# Patient Record
Sex: Female | Born: 1995 | Race: White | Hispanic: No | Marital: Single | Smoking: Never smoker
Health system: Southern US, Community
[De-identification: ages and names within clinical notes are randomized; demographics above are authoritative.]

## PROBLEM LIST (undated history)

## (undated) DIAGNOSIS — Z975 Presence of (intrauterine) contraceptive device: Secondary | ICD-10-CM

## (undated) DIAGNOSIS — F329 Major depressive disorder, single episode, unspecified: Secondary | ICD-10-CM

## (undated) DIAGNOSIS — F509 Eating disorder, unspecified: Secondary | ICD-10-CM

## (undated) DIAGNOSIS — F419 Anxiety disorder, unspecified: Secondary | ICD-10-CM

## (undated) HISTORY — DX: Major depressive disorder, single episode, unspecified: F32.9

## (undated) HISTORY — DX: Anxiety disorder, unspecified: F41.9

## (undated) HISTORY — DX: Presence of (intrauterine) contraceptive device: Z97.5

## (undated) HISTORY — PX: NO PAST SURGERIES: SHX2092

## (undated) HISTORY — DX: Eating disorder, unspecified: F50.9

---
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
## 2017-09-11 ENCOUNTER — Encounter (INDEPENDENT_AMBULATORY_CARE_PROVIDER_SITE_OTHER): Payer: Self-pay

## 2017-09-11 ENCOUNTER — Emergency Department (INDEPENDENT_AMBULATORY_CARE_PROVIDER_SITE_OTHER): Payer: 59

## 2017-09-11 VITALS — BP 118/69 | HR 70 | Temp 98.1°F | Resp 16 | Ht 68.0 in | Wt 140.0 lb

## 2017-09-11 DIAGNOSIS — Z760 Encounter for issue of repeat prescription: Secondary | ICD-10-CM

## 2017-09-11 MED ORDER — TRAZODONE HCL 50 MG OR TABS
50.00 mg | ORAL_TABLET | Freq: Every evening | ORAL | Status: DC
Start: ? — End: 2017-09-18

## 2017-09-11 MED ORDER — VORTIOXETINE HBR 20 MG PO TABS
20.00 mg | ORAL_TABLET | Freq: Every day | ORAL | Status: DC
Start: ? — End: 2017-09-11

## 2017-09-11 MED ORDER — VORTIOXETINE HBR 20 MG PO TABS
20.0000 mg | ORAL_TABLET | Freq: Every day | ORAL | 0 refills | Status: DC
Start: 2017-09-11 — End: 2017-09-18

## 2017-09-11 MED ORDER — LAMICTAL PO
Freq: Every evening | ORAL | Status: DC
Start: ? — End: 2017-09-18

## 2017-09-11 MED ORDER — TRAZODONE HCL 50 MG OR TABS
50.0000 mg | ORAL_TABLET | Freq: Every evening | ORAL | 0 refills | Status: AC
Start: 2017-09-11 — End: 2017-09-18

## 2017-09-11 NOTE — Interdisciplinary (Signed)
Patient Discharge/Education  AVS printed/explained to pt: Yes  Learner: pt  Pain addressed: none  Method: Explanation and Handout  Treatment education given: Yes  Response: Verbalizes understanding  Stable upon discharge: Yes; ok to exit   Pleased w/ service and informed of delay: Yes

## 2017-09-11 NOTE — Interdisciplinary (Signed)
MD at bedside. 

## 2017-09-11 NOTE — Interdisciplinary (Signed)
Two patient identifiers verified.  Pt called from waiting room to exam room, intake done, and informed on plan of care. Comfort measures offered: pt declined.

## 2017-09-11 NOTE — Interdisciplinary (Signed)
AVS printed, given to pt by C. Perez, LVN.

## 2017-09-11 NOTE — Interdisciplinary (Signed)
Round and gave pt bottled water

## 2017-09-12 NOTE — Progress Notes (Signed)
[facility] ATTENDING NOTE    LEXINGTON DEVINE  MRN: [id number]  DOB: 1996/01/30  PMD: No Pcp, Per Patient    CC: Medication Refill    HPI: Stacy Barker is a 21 year old female  has a past medical history of Anxiety and Major depressive disorder, single episode. who presents requesting a medication refill.  She reports that she recently moved from [place] to [place] and [place].  She reports that she has already arranged for a new psychiatrist in town but her 1st visit isn't for another week.  She is scheduled to see Dr. Olegario Messier.  The patient reports that she ran out of her medications and requests a refill to cover her until she follows up with Psychiatry as already scheduled.  Overall she reports that she feels good.  She denies any psychiatric decompensation and specifically denies suicidal or homicidal ideations.  She reports that she has been on Trintellix for 3 years and takes trazodone at night for sleep for the past 5 years at the same dosage as requested.  She presents with empty prescription bottles confirming the mentioned.    PMHx: see hpi  PSHx:  has no past surgical history on file.  Shx:  reports that she has never smoked. She has never used smokeless tobacco. She reports that she drinks alcohol.  FamHx: family history is not on file.  Meds: has a current medication list which includes the following prescription(s): lamotrigine, trazodone, trazodone, and vortioxetine.  ALL: Review of patient's allergies indicates no known allergies.  ROS: all other systems reviewed and negative as per my custom and practice for the above complaint    EXAM  BP 118/69 (BP Location: Right arm, BP Patient Position: Sitting, BP cuff size: Regular)   Pulse 70   Temp 98.1 F (36.7 C)   Resp 16   Ht  (1.727 m)   Wt 63.5 kg (140 lb)   LMP 09/07/2017 (Approximate)   SpO2 99%   BMI 21.29 kg/m2  GEN nad a&o nontoxic well appearing, smiling and pleasant  HEENT anicteric mmm,    NEURO alert and oriented x4,  moving all extremities well, normal speech, pleasant affect    A/P:   21 year old female presents requesting medication refill.  She just ran out of her psychiatric medications which she has been on for a long time.  She reports overall stable psychiatric state with history of major depressive episode once in the past prior to the initiation of treatment.  I am going to go ahead and prescribe her 1 week of her medication to cover her until she gets in to see Psychiatry as mentioned.  Resources were provided and return precautions discussed in regards to the anxiety and depression.    ICD-10-CM ICD-9-CM    1. Encounter for medication refill Z76.0 V68.1 vortioxetine (TRINTELLIX) 20 MG tablet      traZODone (DESYREL) 50 MG tablet         MEDICATIONS PRESCRIBED (FOR DISCHARGED ED PATIENTS)  New Prescriptions    TRAZODONE (DESYREL) 50 MG TABLET    Take 1 tablet (50 mg) by mouth nightly for 7 days.         I discussed with patient the nature of this patient's illness/problem, results of all resulted tests, course of treatment, and prospects for recovery/follow up care needs. patient verbalized understanding and agreement with plan. All questions answered.   Parameters of returning to ED/[place] also discussed with patient, who voiced understanding.  The patient understands to return immediately if the symptoms worsen or new symptoms develop. Additionally,  the patient will return if symptoms persist.

## 2017-09-18 ENCOUNTER — Ambulatory Visit (INDEPENDENT_AMBULATORY_CARE_PROVIDER_SITE_OTHER): Payer: 59 | Admitting: Psychiatry

## 2017-09-18 ENCOUNTER — Encounter (INDEPENDENT_AMBULATORY_CARE_PROVIDER_SITE_OTHER): Payer: Self-pay | Admitting: Psychiatry

## 2017-09-18 VITALS — BP 121/45 | HR 71 | Ht 68.0 in | Wt 147.0 lb

## 2017-09-18 DIAGNOSIS — F39 Unspecified mood [affective] disorder: Principal | ICD-10-CM

## 2017-09-18 DIAGNOSIS — F633 Trichotillomania: Secondary | ICD-10-CM

## 2017-09-18 DIAGNOSIS — F3341 Major depressive disorder, recurrent, in partial remission: Secondary | ICD-10-CM

## 2017-09-18 DIAGNOSIS — F411 Generalized anxiety disorder: Secondary | ICD-10-CM

## 2017-09-18 DIAGNOSIS — F449 Dissociative and conversion disorder, unspecified: Secondary | ICD-10-CM

## 2017-09-18 DIAGNOSIS — F339 Major depressive disorder, recurrent, unspecified: Secondary | ICD-10-CM

## 2017-09-18 DIAGNOSIS — G4700 Insomnia, unspecified: Secondary | ICD-10-CM

## 2017-09-18 DIAGNOSIS — R41840 Attention and concentration deficit: Secondary | ICD-10-CM

## 2017-09-18 MED ORDER — TRAZODONE HCL 50 MG OR TABS
50.0000 mg | ORAL_TABLET | Freq: Every evening | ORAL | 1 refills | Status: DC
Start: 2017-09-18 — End: 2017-11-25

## 2017-09-18 MED ORDER — VORTIOXETINE HBR 20 MG PO TABS
20.0000 mg | ORAL_TABLET | Freq: Every day | ORAL | 1 refills | Status: AC
Start: 2017-09-18 — End: 2017-09-25

## 2017-09-18 MED ORDER — LAMOTRIGINE 25 MG OR TABS
50.0000 mg | ORAL_TABLET | Freq: Every evening | ORAL | 1 refills | Status: DC
Start: 2017-09-18 — End: 2017-11-08

## 2017-09-18 NOTE — Progress Notes (Signed)
[facility] INITIAL VISIT      Identifying Information   Stacy Barker is a 21 yo, single female with history of depression, anxiety, trichotillomania and anorexia who presents for intake.    Referred by -self      Chief Complaint   Chief Complaint   Patient presents with    Depression    Anxiety    Sleep Problem       Review of Systems   1. Constitutional: Negative  2. Eyes: Negative  3. Ears, Nose, Mouth, Throat: Negative  4. Cardiovascular: Negative  5. Respiratory: Negative  6. Gastrointestinal: Negative  7. Genitourinary: Negative  8. Musculoskeletal: Negative  9. Integumentary: Negative  10. Neurological: Negative  11. Psychiatric: depression, anxiety, insomnia  12. Endocrine: Negative  13. Hematologic/Lymphatic: Negative  14. Allergic/Immunologic: Negative        History of Presenting Illness    Student reports primary goal to establish care with new psychiatrist since moving to [facility] for graduate school.  Ran out of psychiatric medication 3 weeks ago and reports worsening mood, with crying, "racing thoughts", low energy, isolation, and intrusive thoughts of "I should just kill myself". Proceeded to go to urgent care 8 days ago and was able to obtain a week supply of her medications. Now reports euthymic, "happy" mood, denies excessive worry, though does report some social anxiety, including worrying what others think of her. Overall, feels she's able to cope with her social anxiety and has made several friends since coming to [place] this August.  Other persistent symptoms including low energy during the day that is chronic "for years" but manageable, as patient is still able to exercise, go out with friends, and complete her graduate work. States class work is going "very well!"  Reports needing 9-10 hours of sleep to feel rested. Generally goes to bed at around 12am and gets up at 8am. Notes that she's on her computer late and that keeps her from getting to bed earlier. Takes  trazodone  nightly to help her fall asleep and this is effective. Reports eating healthy, regular meals and having good appetite.      Current medications: lamictal , trintellix , and trazodone  qhs. Denies any notable side effects.      Symptom Review  Depression -   Patient reports current euthymic mood. 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 Endorsed low mood, hopelessness, anhedonia, and suicidal thoughts at that time. Was admitted for first inpatient psychiatric hospitalization. Reports somewhat improved mood after her hospitalization but still struggling with depression and a suicide attempt the spring of 2014. Reports she has had stable mood for "about 2 years", though she states she's only been on current medication regimen for around 7-8 months and was adjusting   Mania/ hypomania -  Despite initial diagnosis of possible bipolar, denies any history of manic episodes.  Anxiety -   Reports social anxiety as described in HPI above, also past history of concern that others are talking about her or that she might be judged because of her little brother who might have Aspergers. Reports one panic attack in the context of stopping her medications 3 weeks ago but denies other history of panic symptoms. 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 Specifically pulling single strands of hair from her scalp. Denies bald patches. Continues to do this intermittently when she feels "tense".   Attentional problems -  Denies diagnosis of ADHD, stating that she had a form of neuropsych testing that "said I didn't", however, has been prescribed ritalin BID for "focus and to help mood."  Reports she did well in school growing up and does not have trouble with concentration in current graduate program  Disordered eating -   Reports history of anorexia in 2016, stating "I would stop eating for days at a time."  This was  in college and patient stated "it was a form of control for me." Micah Flesher to a treatment program near her school and states that while she was there she became motivated to eat again after learning she wouldn't be allowed to go to study abroad in [place] otherwise. Reports that she stopped restricting and has continued to eat regularly since then. Does report that she exercises 3-5x a week for around 1 hour. Denies any other excessive exercising or restrictive eating currently. Denies current concerns about her weight.  Psychosis -   Denies hx of paranoia, AH or VH.      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 now 3-4. Reports drinking is always in a social setting and never alone. Denies any hx of black outs, drinking while driving, or compromising situations. States that others have thought she may drink to much but denies any concern herself about her drinking.  Cannabis -  Reports rare use but not regularly, occasionally states it helps her anxiety  Amphetamines/Stimulants -  Reports using cocaine in the past, approx 10x in life  Other -  Reports trying club drugs ie MDMA at raves in past but denies current use      Past History    Psychiatric History   Past psychotherapy history -  Reports therapist for 2 years, support/talk therapy, found helpful  Past psychopharmacology trials -   Unsure, will obtain records. Currently lamictal , trazodone , and trintellix  daily.   Past psychiatric hospitalization(s) -   First 2013 for SI, 2nd 2014 after attempted hanging, also reports 3rd approx 2015-16 for SI  Past self-injurious behavior -   Reports attempted hanging as above, failed attempt and  proceeded to tell parents     Relevant Medical History    Family History  Bipolar disorder -   cousin  Anxiety disorder -   aunt    Social/Developmental History    Raised in [place]   Growing up at home in the family - patient reports happy home life with mom, dad, and little brother. Reports brother was somewhat frustrating to patient, as he possibly is on the autism spectrum and would embarrass her. Still close with family.  Intimate relationships - reports 1st of 4 months in college, then reports "casual sexual relationship" that ended upon patient coming to [facility], though he still contacts her. 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  High School - reports she transferred to public school, small, liked it better, however, had to miss the last 2 months due to mental health issues. 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      Objective    Allergies: denies    Medications:  As in HPI, no other medications      Labs Reviewed: none per chart      Vital Signs:   09/18/17  1438   BP: 121/45   Pulse: 71     GAD 7 09/18/2017   Feeling afraid as if something awful might happen 0   Being easily annoyed or irritable 0   Worrying too much about different things 0   Feeling nervous, anxious or on edge 1   Trouble relaxing 0   Being so restless that it is hard to sit still 1   GAD7 Patient Total 2   Not being  able to stop or control worrying 0     FM PHQ9 score 09/18/2017   PHQ9 Patient Summary Score (calculated) 6          Musculoskeletal  Ambulates with normal gait; no tremors     Mental Status Exam    Physical appearance: casually dressed, well groomed, well built  Relatedness: engaged  Eye contact: good  Attitude: cooperative  Language/Speech quality: clear with normal rate, tone and volume  Motor behavior: animated, using a lot of hand gestures  Mood: I feel stable, happy"  Affect: congruent with mood, bright, and ranges appropriately based on content  Thought process: linear, organized  Thought content: no evidence of psychotic process  Suicidal ideation: none  Homicidal ideation: none  Orientated to: person, place, time  Sensorium: intact  Attention/Concentration: intact  Memory: intact for recent and remote [employer] of knowledge: consistent with post secondary education  Insight: intact  Judgment: intact         Medical Decision Making      Narrative Assessment  Merin Belmar is a 21 yo, single female with history of depression, anxiety, trichotillomania and anorexia who presents for intake.    Patient presents with euthymic mood and fairly-well controlled anxiety after a period of instability following running out of her medications 3 weeks ago. Currently tolerating lamictal , trintellix , and trazodone  qhs well without any side effects. Also takes Ritalin BID, though unsure of the dosing. Feels she is stable on this medication regimen and plan to continue for now. Will adjust ritalin dose after confirming with previous provider Dr. Friday. Residual symptoms notable for daily fatigue, though mild enough that patient remains very active physically, academically, and socially. Recommended establishing primary care provider and labs to include thyroid screening. Given history of suicide attempt and decompensation in times of stress, also recommended establishing care with a therapist to create  a source of support in crises.       Today, spent time reviewing the benefits of combined treatment (meds+psychotherapy). Described need to treat all target symptoms robustly without significant side effects before entering maintenance phase of treatment. Introduced the concept of therapeutic lifestyle changes (sleep hygiene, healthy diet,  regular exercise, minimal substance use, relaxation).        Risk Assessment  Currently does not represent an imminent danger to self or others and is not gravely disabled. Therefore does meet criteria for a 5150 involuntary hold, or activation of this process.     Psychiatric Diagnoses  Mood disorder  Insomnia  MDD, recurrent  GAD  H/o conversion disorder, single episode  H/o anorexia  Trichotillomania    Differential Diagnosis -   Unspecified depressive disorder, likely MDD  Social anxiety disorder        INTERVENTIONS AND PLAN    Comprehensive bio-psycho-social assessment was completed. Psycho-education, medication management, supportive psychotherapy. We discussed side effects/risks/benefits of medications in detail, alternative treatments. Patient asked questions, and agreed to treatment. Session length was 90 minutes, including 20 minutes spent in psychotherapy, spent time building therapeutic alliance.    Plan   Medical Management:   o Continue trintellix  daily for mood and anxiety  o Continue lamictal  daily for mood  o Continue trazodone  nightly for insomnia  o Screening labs - recommended thyroid function screening labs and medical workup for fatigue       Non-pharmacologic Strategies:   o Encouraged healthy diet, exercise routines and sleep hygiene  o Therapy - patient to schedule f/u with therapist following today's intake      Case Management  o Appreciate coordination of services w/ other [facility] providers  o OSD - patient not interested in OSD services at this time          Disposition  Return to clinic with Dr. Olegario Messier in 6-8 weeks  Return or call  as needed; numbers provided for both urgent and routine contact  Continue with primary care provider as needed    Christel Mormon, MD  PGY3 Psychiatry Resident    Attending Comment:    I saw the patient, performed a comprehensive history and psychiatric examination after the resident evaluated the patient, and conducted an evaluation of the patient.I discussed management and treatment plan with the patient and resident. I reviewed the residents note and made changes/edits as needed. I agree with the documented findings and plan of care.Total face-to-face time spent by the attending delivering supportive psychotherapy and psychoeducation was 20 minutes.    Marlowe Alt, MD

## 2017-10-18 ENCOUNTER — Encounter (INDEPENDENT_AMBULATORY_CARE_PROVIDER_SITE_OTHER): Payer: 59 | Admitting: Licensed Clinical Social Worker

## 2017-10-18 NOTE — Interdisciplinary (Signed)
Progress Note:  Date of Visit: 10/18/17  Reason for Visit: Referral from [facility]  Presenting Problem:  The student presented to [facility] urgent care on 10/17/17 for anxiety and depression. The [facility] and scheduled an appointment with this provider to come in for bridge care and to be connected to campus services. The student stated that she has had difficulty since coming to [place] this year. The student attends [employer] as a [employer] student in the [employer] school. The student endorsed stress due to academic and financial challenges. The student stated that [employer] is more challenging academically than her previous school and this is causing her to experience low self esteem, anxiety, and depression sxs. Additionally, the student stated that she is responsible for a large part of her finances and that adds considerable stress. The student became tearful as she stated that other students in the program who don't need help seem to be getting a great deal more assistance and support than she is, and that causes her to feel as though "no one at [employer] cares about me or whether I make it." The student stated that she had difficulties with anxiety and self esteem while in high school, but while attending [employer] as an under graduate she did not experience these difficulties. However, now that she is at [employer] she is beginning to experience those sxs again and it concerns her because, "I don't want to go back to that." The student also endorsed binge drinking (1-2 times per week), and intermittent cocaine use (last use 2 weeks ago). The student stated that she believes she is engaging in these behaviors because she is unhappy at [employer] and is concerned that she feels pulled in that direction because it distracts her from how bad she feels at . The student wants to succeed at  but feels under supported and overwhelmed by her academic and financial challenges.        Intervention:  Used active  listening and supportive therapy to build rapport and convey empathy. Asked the client open ended questions to help identify triggers to MH sxs and substance use. Asked CBT oriented questions to help the client identify distorted beliefs which may be contributing to anxiety and low self esteem. Provided psychoeducation on substance use. The student signed an ROI for student affairs. I moved the student's next appointment with her psychiatrist up one week. I provided her with my contact information and the contact information for Student Affairs.   Response:  The client was initially vague but was more willing to share as the session progressed. The client was tearful at times as she described her struggles with academics, finances, and substances. The client was appreciative of being mirrored and supported. The client declined to schedule a follow up with this provider but asked to have a card in case she felt the need for supportive therapy before her first appointment with her psychologist in 4 weeks.   MSE:  Appearance: Above average height, average build, Caucasian female. Well groomed and casually dressed.   Mood: anxious  Affect: congruent  Motor: fidgeting  Orientation: Ox4  Eye contact: good  Attitude: cooperative  Cognitive capacity: average fund of knowledge, good recall, good abstraction, able to track questions.   Thought process: moderately circumstantial  Thought content: No SI/HI/AVH endorsed or observed at session.   Insight: average  Judgment: average  Plan:  To see psychiatrist Dr. Olegario MessierBhakta 10/23/17. To see psychologist Alonna MiniumJamie Gannon 11/25/17. To  [facility]    Time spent in providing services:  60 minutes

## 2017-10-23 ENCOUNTER — Encounter (INDEPENDENT_AMBULATORY_CARE_PROVIDER_SITE_OTHER): Payer: Self-pay | Admitting: Psychiatry

## 2017-10-23 ENCOUNTER — Ambulatory Visit (INDEPENDENT_AMBULATORY_CARE_PROVIDER_SITE_OTHER): Payer: 59 | Admitting: Psychiatry

## 2017-10-23 VITALS — BP 114/71 | HR 63 | Ht 68.0 in | Wt 144.0 lb

## 2017-10-23 DIAGNOSIS — F633 Trichotillomania: Secondary | ICD-10-CM

## 2017-10-23 DIAGNOSIS — F330 Major depressive disorder, recurrent, mild: Principal | ICD-10-CM

## 2017-10-23 DIAGNOSIS — G4700 Insomnia, unspecified: Secondary | ICD-10-CM

## 2017-10-23 DIAGNOSIS — F449 Dissociative and conversion disorder, unspecified: Secondary | ICD-10-CM

## 2017-10-23 DIAGNOSIS — R41840 Attention and concentration deficit: Secondary | ICD-10-CM

## 2017-10-23 DIAGNOSIS — F39 Unspecified mood [affective] disorder: Secondary | ICD-10-CM

## 2017-10-23 DIAGNOSIS — F411 Generalized anxiety disorder: Secondary | ICD-10-CM

## 2017-10-23 MED ORDER — BUPROPION HCL 100 MG OR TABS
100.0000 mg | ORAL_TABLET | Freq: Every day | ORAL | 0 refills | Status: DC
Start: 2017-10-23 — End: 2017-11-08

## 2017-10-23 MED ORDER — VORTIOXETINE HBR 20 MG PO TABS
20.0000 mg | ORAL_TABLET | Freq: Every day | ORAL | 0 refills | Status: DC
Start: 2017-10-23 — End: 2017-11-25

## 2017-10-23 NOTE — Progress Notes (Signed)
PSYCHIATRIC E&M Follow Up    Stacy Barker  [id number]  10/23/2017  Psychiatry Specialty Clinics Established Outpatient Follow Up Visit: [id number]  PSY LA JOLLA PROF BLDG  [facility]  [address]  [place] [id number]  [facility]    Attending visit: Patient   Arrived: On Time     CC:  Chief Complaint   Patient presents with    Depression    Attentional Problem    Anxiety       Identification: 21 year old female  Living situation/Relationship/Children:   Living in grad housing with roommate, currently not in any relationship, never married, no children  Employment:  Full time Psychologist, clinicalgrad student  Significant Stressors: school, interpersonal, financial    Review of Systems   1. Constitutional: Negative  2. Eyes: Negative  3. Ears, Nose, Mouth, Throat: Negative  4. Cardiovascular: Negative  5. Respiratory: Negative  6. Gastrointestinal: Negative  7. Genitourinary: Negative  8. Musculoskeletal: Negative  9. Integumentary: Negative  10. Neurological: Negative  11. Psychiatric: Depression, Anxiety, trichotillomania  12. Endocrine: Negative  13. 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    Plan from initial visit, was: continued on trintellix 20 mg daily, lamictal 50 mg daily and trazodone 50 mg daily and referred to therapist but pt was reluctant to see one. Pt went to [facility] after doing poorly on some of her mid- terms last week. Pt was seen by Rosanne AshingJim on Friday last week and scheduled for an emergency FU visit with me today.    Begins with I  am not at a good place." Pt states that she didn't do as well on her mid-terms as she was expecting. She states she was used to cramming her studies last minute and get A on her exams and this time she was not able to do so. Pt states she zones out during lectures and feels she can catchup on the material at home but gets distracted and not able to study at home. She understands she should not be using cocaine and states it is very difficult for her to resist using it if it is freely available in front of her. Pt used cocaine 2 weeks back. She also has been engaging in binge drinking over weekends. She is struggling to adapt to the quarter system and feels she is not able to have a life for herself. She denies any thoughts of hurting self or others. She reports feeling anxious about school and depressed because of her results. We discussed strategies to stay organized and study daily and change her expectations and life style. She denies any sleep or appetite changes.       Progress in Treatment  Depression -   positive  Mania/ hypomania - N/A   Anxiety -   positive  Attentional problems -   positive  Psychosis - N/A  Functioning- good social but trouble with school assignments    Lifestyle Habits  Diet - trying to eat healthy  Exercise - regular  Sleep - okay  Substance use - as above    Current Target Symptoms anxiety, attentional problems, substance use, depression      Side Effects  None noted  Interim Medical History  No new changes    Interim Social History  Spending time with friends from [place].  PFSH  Comprehensive PFSH which was performed during a previous encounter was re-examined and reviewed with the patient. There is nothing new to add today. For details, please refer to my previous note in this chart, dated 09/18/17    School/Work Progress    Current classes: 3  Anticipated graduation date:2020    Current Relationships:  States being in toxic relationship long distance          Objective  Allergies: as  reviewed by me this visit in EPIC    Medications: as reviewed by me this visit in EPIC      Vitals  Vitals:    10/23/17 1441   BP: 114/71   BP Location: Left arm   BP Patient Position: Sitting   BP cuff size: Regular   Pulse: 63   Weight: 65.3 kg (144 lb)   Height: 5\' 8"  (1.727 m)     Body mass index is 21.9 kg/(m^2).      Labs Reviewed: last labs dated  Psychiatric Rating Scales: none     Musculoskeletal   Ambulates with normal gait; no tremors       Mental Status Exam    Physical appearance: casually dressed, well groomed, well built  Relatedness: engaged  Eye contact: good  Attitude: cooperative  Language/Speech quality: clear with normal rate, tone and volume  Motor behavior: animated, using a lot of hand gestures  Mood: I feel depressed"  Affect: congruent with mood, mildly constricted and ranges appropriately based on content  Thought process: linear, organized  Thought content: no evidence of psychotic process  Suicidal ideation: none  Homicidal ideation: none  Orientated to: person, place, time  Sensorium: intact  Attention/Concentration: intact  Memory: intact for recent and remote [employer] of knowledge: consistent with post secondary education  Insight: intact  Judgment: intact      Medical Decision Making    Narrative Assessment  This is a 21 year old  female with a past psychiatric history of of depression, anxiety, trichotillomania, conversion disorder single episode and anorexia, family psychiatric history of anxiety and bipolar disorder on lamictal 50 mg daily, trintellix 20 mg daily and trazodone 50 mg qhs presented with depressed mood, anxiety in the context of school stressor and recent cocaine use.  Symptoms are consistent with MDD recurrent, GAD, attention and concentration deficit, mood disorder, trichotillomania, cocaine abuse, binge drinking .      Risk Assessment  Currently does not represent an imminent danger to self or others and is not gravely disabled. Therefore does not meet  criteria for a 5150 involuntary hold, or activation of this process.     DSM Diagnosis    ICD-10-CM ICD-9-CM    1. MDD (major depressive disorder), recurrent episode, mild (CMS-HCC) F33.0 296.31 vortioxetine (TRINTELLIX) 20 MG tablet      buPROPion (WELLBUTRIN) 100 MG tablet   2. Mood disorder (CMS-HCC) F39 296.90    3. Attention and concentration deficit R41.840 799.51    4. 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 Conversion disorder F44.9 300.11    6. GAD (generalized anxiety disorder) F41.1 300.02    7. Insomnia, unspecified type G47.00 780.52        INTERVENTIONS AND PLAN  Psycho-education, medication management, supportive and cognitive/behavioral psychotherapy. We discussed side effects/risks/benefits of medications in detail and alternative treatments. Patient asked questions and agreed to treatment. 30 minutes was spend in providing supportive psychotherapy for school stressors, motivational interviewing for lifestyle changes and behavioral strategies for staying organized.    Plan:            Medical Management:    Continue trintellix 20mg  daily for mood and anxiety   Continue lamictal 50mg  daily for mood   Continue trazodone 50mg  nightly for insomnia   Start bupropion 100 mg in the morning for now   Screening labs - recommended thyroid function screening labs and medical workup for fatigue                      Non-pharmacologic Strategies:    Encouraged healthy diet, exercise routines and sleep hygiene  Therapy - patient to schedule f/u with therapist.    Case Management   Appreciate coordination of services w/ other [facility] providers   Referred to [facility]         Disposition  Return to clinic in 2 weeks  Return or call as needed; numbers provided for both urgent and routine contact  Continue with primary care provider as needed     CPT Code: [id number]          Marlowe AltSavita Kaisha Wachob, MD  Assistant Professor  Board Certified Psychiatry and Neurology  [facility]

## 2017-10-30 ENCOUNTER — Encounter (INDEPENDENT_AMBULATORY_CARE_PROVIDER_SITE_OTHER): Payer: 59 | Admitting: Psychiatry

## 2017-11-05 ENCOUNTER — Emergency Department
Admission: EM | Admit: 2017-11-05 | Discharge: 2017-11-06 | Disposition: A | Discharge status: Home / Self Care | Payer: 59 | Attending: Emergency Medicine | Admitting: Emergency Medicine

## 2017-11-05 DIAGNOSIS — Z3200 Encounter for pregnancy test, result unknown: Secondary | ICD-10-CM

## 2017-11-05 DIAGNOSIS — F329 Major depressive disorder, single episode, unspecified: Principal | ICD-10-CM | POA: Insufficient documentation

## 2017-11-05 DIAGNOSIS — Z79899 Other long term (current) drug therapy: Secondary | ICD-10-CM | POA: Insufficient documentation

## 2017-11-05 DIAGNOSIS — F32A Depression, unspecified: Secondary | ICD-10-CM

## 2017-11-05 DIAGNOSIS — Z833 Family history of diabetes mellitus: Secondary | ICD-10-CM | POA: Insufficient documentation

## 2017-11-05 DIAGNOSIS — Z818 Family history of other mental and behavioral disorders: Secondary | ICD-10-CM | POA: Insufficient documentation

## 2017-11-05 DIAGNOSIS — F1490 Cocaine use, unspecified, uncomplicated: Secondary | ICD-10-CM | POA: Insufficient documentation

## 2017-11-05 DIAGNOSIS — F430 Acute stress reaction: Secondary | ICD-10-CM | POA: Insufficient documentation

## 2017-11-05 DIAGNOSIS — Z0000 Encounter for general adult medical examination without abnormal findings: Secondary | ICD-10-CM

## 2017-11-05 DIAGNOSIS — T1491XA Suicide attempt, initial encounter: Secondary | ICD-10-CM | POA: Insufficient documentation

## 2017-11-05 DIAGNOSIS — F419 Anxiety disorder, unspecified: Secondary | ICD-10-CM | POA: Insufficient documentation

## 2017-11-05 DIAGNOSIS — R45851 Suicidal ideations: Secondary | ICD-10-CM

## 2017-11-05 NOTE — ED Notes (Signed)
Per CCP Rn said pt ok to have cell phone at bedside. Cell phone case is a wallet with pt's ID, CC, debit card, Ins card, and student ID in it at bedside.

## 2017-11-05 NOTE — ED Notes (Signed)
Bed: 14A  Expected date:   Expected time:   Means of arrival:   Comments:  Triage 5150

## 2017-11-06 DIAGNOSIS — R45851 Suicidal ideations: Secondary | ICD-10-CM

## 2017-11-06 DIAGNOSIS — F329 Major depressive disorder, single episode, unspecified: Principal | ICD-10-CM

## 2017-11-06 LAB — UR DRUGS OF ABUSE SCREEN
Amphetamines Screen: NEGATIVE
Barbiturates Screen: NEGATIVE
Benzodiazepine Screen: NEGATIVE
Cocaine Screen: NEGATIVE
Methadone Screen: NEGATIVE
Opiates Screen: NEGATIVE
Oxycodone Screen: NEGATIVE
Phencyclidine Screen: NEGATIVE
THC Screen: NEGATIVE

## 2017-11-06 LAB — ACETAMINOPHEN, BLOOD: Acetaminophen: <5 ug/mL — ABNORMAL LOW (ref 10–30)

## 2017-11-06 MED ORDER — LAMOTRIGINE 25 MG OR TABS
50.0000 mg | ORAL_TABLET | Freq: Every evening | ORAL | Status: DC
Start: 2017-11-06 — End: 2017-11-06
  Administered 2017-11-06: 50 mg via ORAL
  Filled 2017-11-06: qty 2

## 2017-11-06 MED ORDER — TRAZODONE HCL 50 MG OR TABS
50.0000 mg | ORAL_TABLET | Freq: Every evening | ORAL | Status: DC
Start: 2017-11-06 — End: 2017-11-06
  Administered 2017-11-06 (×2): 50 mg via ORAL
  Filled 2017-11-06: qty 1

## 2017-11-06 NOTE — Discharge Instructions (Signed)
You have been scheduled for a Psychiatry Transition/[facility] appointment, for a check-in while you await your outpatient psychiatry appointment.    For your transition clinic appointment, please check in on the second floor of:   [facility]  [address]   [place] [id number]   on Thursday 11/07/17 at 1:00pm    Your next appointment with your outpatient psychiatrist Dr. Olegario MessierBhakta is on Friday 11/08/17. Please keep your appointment.    The [facility] case manager will be notified that you were seen in the emergency department today. They may also contact you to follow-up and ensure that you made your follow-up appointments.    Thank you for [facility] at [facility].  It has been determined that you are safe to be discharged home.  Please remember the discharge instructions that your physician and nurse reviewed with you.      As always it is important to follow-up with your primary care physician if you have ongoing medical difficulty.        Depression    After evaluation, your doctor feels that you are suffering from depression.    Depression is an illness that can make you feel guilty or worthless. It can make it hard to concentrate or make you lose interest in things you usually enjoy. It may also affect your ability to eat, sleep, and function like normal. Depression is caused by a chemical imbalance in the brain that affects mood. Depression can be treated!   The diagnosis of depression is given when these symptoms are present every day for at least 2 weeks.    It is important to follow up with a counselor as well as your family doctor. If you do not have an appointment in the next 2 to 3 days, call and make one. It is VERY IMPORTANT that your counselor and family doctor know that your problems are getting worse.    It is important to have a counselor and a family doctor who see you on a regular basis. They can help you with your  problem, keep a close eye on you and follow your progress. You have been given a list of phone numbers to call if you feel that you need to talk to someone before your regular appointment.    Drinking alcohol can make your depression worse. Do not drink alcohol while taking antidepressant medication.    YOU SHOULD SEEK MEDICAL ATTENTION IMMEDIATELY, EITHER HERE OR AT THE NEAREST EMERGENCY DEPARTMENT, IF ANY OF THE FOLLOWING OCCURS:   You do not feel safe in your home environment.   You think of harming yourself or suicide.   You think of harming others.   You become worse and feel that you cannot wait until your follow-up appointment for treatment.

## 2017-11-06 NOTE — ED Notes (Signed)
Per psych, pt likely admit.

## 2017-11-06 NOTE — ED Notes (Signed)
Psych MD at bedside

## 2017-11-06 NOTE — ED Follow-up Note (Signed)
Follow-up type: Callback       Routine ED Patient Call Back    Patient unable to be contacted, no message left

## 2017-11-06 NOTE — ED Notes (Signed)
Assumed care.  Pt sleeping, resp/even/unlabored.  Sitter at bedside

## 2017-11-06 NOTE — ED Notes (Signed)
Psych at bedside.

## 2017-11-06 NOTE — ED MD Progress Note (Signed)
Workup Review   Others' Documentation   Comment By Time   Will provide nighttime medications, patient has been compliant with lamictal every night.  Ordered trazodone and lamictal.  Patient aware that psych will evaluate. Cruz CondonBrumme, Kristina M, MD 11/28 0203   Psych to eval Cruz CondonBrumme, Kristina M, MD 11/28 [phone]   Paged psych Cruz CondonBrumme, Kristina M, MD 11/28 [phone]     51F, attempted to hang self in closet by PD on 5150  No ligatrue marks, voice change  Very low suspicion concomitant overdose     Medically cleared, no imaging warranted   UTOx neg    [x]  DPP

## 2017-11-06 NOTE — ED MD Progress Note (Signed)
21yo with h/o depression, p/s/p SI attempt, hanging. No actual neck trauma, no ttp or signs of trauma. On 5150. Minimal EtOH, no intoxicated, denies other ingestion, presentation not c/w a particular toxidrome. Med cleared, will consult psych

## 2017-11-06 NOTE — ED Notes (Signed)
Pt provided with meal tray.

## 2017-11-06 NOTE — ED Notes (Signed)
This writer is finished sitting for this pt. Turnover to ED tech Britnee.

## 2017-11-06 NOTE — ED Notes (Signed)
This writer breaking sitter for lunch, report received from CCP Natasha

## 2017-11-06 NOTE — ED Notes (Signed)
5150 in psych binder

## 2017-11-06 NOTE — ED Notes (Signed)
Discussed discharge including f/u w/ psych oupt as indicated w/ contact info in discharge doc.  Pt verbalized understanding.  Pt aox4, steady gait, in nad, vss and no piv at time of DC.  Pt belongings returned including cell phone; pt verbalized receipt of all belongings.  Pt [facility] [place].

## 2017-11-06 NOTE — ED Provider Notes (Signed)
Emergency Department Note  [facility] electronic medical record reviewed for pertinent medical history.     Nursing Triage Note:   Chief Complaint   Patient presents with    Psychiatric Evaluation     pt BIB [place] [employer] on 5150 for DTS.  Tried to hang herself in her apartment tonight.  drank about 4 beers tonight, been taking medications as scripted, friendships triggered tonight.  denies HI, AH, VH       HPI:   21 year old female with a PMH significant for depression and MDD, [place], next appt on Friday.  Attempted to hang herself in her apartment, posted about it and friends reported it.  Was picked up by PD and brought in on 5150.      Significant depression that has been worse while in graduate school, had difficulty with cocaine use after starting and moving to [place], that has been improving, had 3-4 beers earlier in the evening and feels like her drinking continues to be a problem.  No other coingestions.  Prior history for suicide attempt at approximately age 21.      Triggers based on friendships, patient describes herself as "dirty, or unclean compared to her friends" and that they are "good or better people" since they don't drink and haven't done drugs.  Feels like she doesn't deserve to be around.  Feels like suicide would be best.      No current facility-administered medications for this encounter.     Current Outpatient Prescriptions:     buPROPion (WELLBUTRIN) 100 MG tablet, Take 1 tablet (100 mg) by mouth daily., Disp: 30 tablet, Rfl: 0    lamoTRIgine (LAMICTAL) 25 MG tablet, Take 2 tablets (50 mg) by mouth nightly., Disp: 60 tablet, Rfl: 1    traZODone (DESYREL) 50 MG tablet, Take 1 tablet (50 mg) by mouth nightly., Disp: 30 tablet, Rfl: 1    vortioxetine (TRINTELLIX) 20 MG tablet, Take 1 tablet (20 mg) by mouth daily., Disp: 30 tablet, Rfl: 0    Only new medication: wellbutrin, just started this morning.    NKDA    HPI    Past Medical History:   Diagnosis Date    Anxiety        Major depressive disorder, single episode        No past surgical history on file.    Family History:      Family History   Problem Relation Age of Onset    Diabetes Mother     Diabetes Father     Psychiatry Paternal Aunt     Psychiatry MCousin        Social History:  -tob, +EtOH, +cocaine,  lives at school.     Social History   Substance Use Topics    Smoking status: Never Smoker    Smokeless tobacco: Never Used    Alcohol use 144.0 oz/week     12 Cans of beer per week      Comment: Pt states drinks appx 3/week- beer and liquor       Medications:   Prior to Admission Medications   Prescriptions Last Dose Informant Patient Reported? Taking?   buPROPion (WELLBUTRIN) 100 MG tablet   No No   Sig: Take 1 tablet (100 mg) by mouth daily.   lamoTRIgine (LAMICTAL) 25 MG tablet   No No   Sig: Take 2 tablets (50 mg) by mouth nightly.   traZODone (DESYREL) 50 MG tablet   No No  Sig: Take 1 tablet (50 mg) by mouth nightly.   vortioxetine (TRINTELLIX) 20 MG tablet   No No   Sig: Take 1 tablet (20 mg) by mouth daily.      Facility-Administered Medications: None       Allergies: Review of patient's allergies indicates no known allergies.    Review of Systems:     Review of Systems   Constitutional: Positive for activity change and appetite change. Negative for fever.   HENT: Negative for congestion and rhinorrhea.    Respiratory: Negative for cough, shortness of breath and wheezing.    Gastrointestinal: Negative for abdominal pain, constipation, diarrhea, nausea and vomiting.   Skin: Negative for rash.   Psychiatric/Behavioral: Positive for sleep disturbance and suicidal ideas. Negative for self-injury. The patient is nervous/anxious.    All other systems reviewed and are negative.    All other systems reviewed and negative unless otherwise noted in the HPI or above. This was done per my custom and practice for systems appropriate to the chief complaint in an emergency department setting and varies depending on the  quality of history that the patient is able to provide.      Physical Exam:   11/05/17  2320   BP: 127/76   Pulse: 80   Resp: 16   Temp: 99.1 F (37.3 C)   SpO2: 98%     Nursing note and vitals reviewed.     Physical Exam   Constitutional: She is oriented to person, place, and time. She appears well-developed and well-nourished. No distress.   Intermittently tearful   HENT:   Head: Normocephalic and atraumatic.   Eyes: Pupils are equal, round, and reactive to light.   Neck: Normal range of motion. Neck supple. No tracheal deviation present.   No tenderness on evaluation of the neck, no ligature marks or petechia noted on exam, no crepitus   Cardiovascular: Normal rate, regular rhythm, normal heart sounds and intact distal pulses.    No murmur heard.  Pulmonary/Chest: Effort normal and breath sounds normal. No respiratory distress.   Abdominal: Soft. Bowel sounds are normal. There is no tenderness.   Neurological: She is alert and oriented to person, place, and time.   Skin: Skin is warm.   Psychiatric: She has a normal mood and affect.       Workup Review:  Workup Review   Gean MaidensBrumme, Mohamed Portlock M's Documentation   Comment Time   Psych to eval 11/28 0050   Paged psych 11/28 0046         Impression & Initial ED Plan:  21 year old  female presents with suicide attempt by hanging, brought in by [place] after it was reported by friends due to posting about it on social media.      #Suicide attempt: patient currently on 5150, still endorsing that she feels like "she'd be better off if she wasn't here" and that she would try to hang herself better.  Prior attempt.  No evidence of significant ligature marks or other neck injury, no focal findings suggestive of vertebral artery dissection.  Per patient's description, was unable to place a lot of weight on the scarf chosen so unclear how long she attempted to hang herself, no evidence of impending respiratory failure.    -psych eval  -if still here in AM, home meds      The rest of  the ED course, results, and plan for the patient is in a separate continuation note. Please see that note  for details.       I have discussed my evaluation and care plan for the patient with the attending physician Dr. Shaune Pascal.      Gean Maidens, MD PGY5  [place] Fellow  11/06/17  1:26 AM       Cruz Condon, MD  Resident  11/06/17 [phone]

## 2017-11-06 NOTE — ED Notes (Signed)
Per psych, dispo possible DC pending securing appt w/ counselor.  Pt verbalizes understanding she is still on hold until final dispo from ERMD and psych.
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
## 2017-11-06 NOTE — ED Notes (Signed)
This writer is beginning sitting for this pt. Pt is currently sleeping in bed in NAD, bed locked and lowered for safety. Pt is on 5150 dressed in buttoned psych gown with belongings locked in psych locker. Pt cell phone with self. Monitor cables, cords, and tubing removed from room for safety.

## 2017-11-06 NOTE — ED Notes (Signed)
Pt bib police on a 5150 for SI/SA.   Per pt, she has dealt with depression in the past and does have a psychiatrist that she currently sees. States that today she was "having a hard time." consumed some ETOH earlier in the day and got into an argument with her friend. She has also been talking to a guy "who doesn't seem to want to talk to me anymore." she becomes tearful when asked about SA today. Admits to attempting to hand herself with a soft scarf. She initially tried to tie it on the door knob but it didn't hold then she states that she went into the closet and tried to tightening the scarf around her neck. Denies any LOC, no ligature marks, denies dizziness, and with a normal voice. Per pt, she was never hanging from the scarf. [place] was called by a concerned friend.  Pt states that she would feel safe going home if "I need a few days off of school and I just need someone that I can talk to right now." she admits that she feels comfortable with her current psychiatrist but did not reach out to that person tonight.  Pt states that she is "very close" to her family in [place], but "I don't want to worry them."  Calm/cooperative. Sitter at bedside  Pt aware that she is on a 5150

## 2017-11-06 NOTE — ED Notes (Addendum)
This writer now sitter for pt. Pt in psych gown/pants and all cords/cables removed from pt room.

## 2017-11-07 ENCOUNTER — Telehealth (INDEPENDENT_AMBULATORY_CARE_PROVIDER_SITE_OTHER): Payer: Self-pay | Admitting: Licensed Clinical Social Worker

## 2017-11-07 ENCOUNTER — Ambulatory Visit (INDEPENDENT_AMBULATORY_CARE_PROVIDER_SITE_OTHER): Payer: 59 | Admitting: Mental Health

## 2017-11-07 DIAGNOSIS — F330 Major depressive disorder, recurrent, mild: Principal | ICD-10-CM

## 2017-11-07 NOTE — Telephone Encounter (Addendum)
I phoned the student on 11/07/17 and left a VM offering to meet with her Friday 11/08/17 either before or after her appointment with her psychiatrist (Dr. Olegario MessierBhakta) [facility]. I offered to provide bridge care, to explore more intensive treatment options and to help connect the student to those options if she desired. The student phoned me back later in the day and requested that we meet after her appointment with Dr. Olegario MessierBhakta, at approximately 2pm.    Verl BlalockJim Pamela Intrieri  [facility] Social Worker

## 2017-11-08 ENCOUNTER — Ambulatory Visit (INDEPENDENT_AMBULATORY_CARE_PROVIDER_SITE_OTHER): Payer: 59 | Admitting: Psychiatry

## 2017-11-08 ENCOUNTER — Encounter (INDEPENDENT_AMBULATORY_CARE_PROVIDER_SITE_OTHER): Payer: 59 | Admitting: Licensed Clinical Social Worker

## 2017-11-08 ENCOUNTER — Encounter (INDEPENDENT_AMBULATORY_CARE_PROVIDER_SITE_OTHER): Payer: Self-pay | Admitting: Psychiatry

## 2017-11-08 VITALS — BP 116/73 | HR 72 | Resp 17 | Ht 68.0 in | Wt 144.9 lb

## 2017-11-08 DIAGNOSIS — F331 Major depressive disorder, recurrent, moderate: Principal | ICD-10-CM

## 2017-11-08 DIAGNOSIS — F39 Unspecified mood [affective] disorder: Secondary | ICD-10-CM

## 2017-11-08 DIAGNOSIS — R41840 Attention and concentration deficit: Secondary | ICD-10-CM

## 2017-11-08 DIAGNOSIS — F1010 Alcohol abuse, uncomplicated: Secondary | ICD-10-CM

## 2017-11-08 DIAGNOSIS — F633 Trichotillomania: Secondary | ICD-10-CM

## 2017-11-08 DIAGNOSIS — F1410 Cocaine abuse, uncomplicated: Secondary | ICD-10-CM

## 2017-11-08 DIAGNOSIS — F411 Generalized anxiety disorder: Secondary | ICD-10-CM

## 2017-11-08 MED ORDER — LAMOTRIGINE 25 MG OR TABS
ORAL_TABLET | ORAL | 0 refills | Status: DC
Start: 2017-11-08 — End: 2017-11-25

## 2017-11-08 MED ORDER — BUPROPION HCL 100 MG OR TABS
150.0000 mg | ORAL_TABLET | Freq: Every day | ORAL | 0 refills | Status: DC
Start: 2017-11-08 — End: 2018-02-12

## 2017-11-08 NOTE — Interdisciplinary (Addendum)
Progress Note:  Date of Visit: 11/08/17  Reason for Visit: To connect student to a higher level of care  Presenting Problem:  The student has been experiencing an increase in anxiety and depression sxs since beginning graduate school this year at the [employer] ([employer]) program at [employer]. The [facility]. On 11/05/17 the [facility] for a suicide attempt by hanging. The student was discharged on 11/06/17 with plans [facility] and [facility] Dr. Olegario MessierBhakta, on 11/08/17. Following her appointment with Dr. Olegario MessierBhakta on 11/08/17 the student came directly to this writer's office in order to receive bridge care, as well as assistance with the necessary ROI's for coordination of care, a written letter for her program director, and to discuss the possibility of attending an IOP. The student endorsed having had a "terrible" week but stated that she was feeling better now and felt safe to go home to her apartment for the weekend. The student's primary stressors are academic, financial and social. The student also struggles with alcohol and cocaine substance abuse. The student endorses low self-esteem and negative self image as primary components of her depression sxs. The student endorses feeling overwhelmed by academic difficulties and financial stressors, and using substances and excessive socializing to manage her emotions. The student is future oriented and has plans for her career after graduation..  Intervention:  Practiced supportive therapy and active listening to convey empathy and build rapport. Assessed for safety. Reviewed safety planning and strategies. 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 I provided the student with a letter for her grad dept requesting assistance with modified class  schedule due to current mental health challenges. Phoned [facility] with the student to request information and an appointment for evaluation. Filled out referral for [facility], faxed it, and contacted them to confirm the student had an appointment for an evaluation. Scheduled the student to see me next Friday 11/15/17 for bridge care and to help with further case management needs.   Response:  The student was cooperative and receptive. The student stated that she would be reaching out to her family and friends to talk about what happened over the past week and to seek support. The student stated that she planned to find a "new game" over the weekend as a way of distracting herself and coping with stress. The student stated that she was eager to attend an intake appointment at the [facility]. The student remarked that she was looking forward to going home to [place] to see her family over the holiday break. The student stated that after our appointment she was going directly to see the director of the GPS program to discuss options for academic assistance. The student was appreciative of all assistance provided and asked to be seen again for bridge care next Friday 11/15/17.      MSE:  Appearance: Average height, average build, Caucasian female, adequately groomed, moderately disheveled blond hair and casually dressed.    Mood: anxious   Affect: congruent  Motor: normal  Orientation: Ox4  Eye contact: fair  Attitude: cooperative  Cognitive capacity: average fund of knowledge, average recall, average abstraction, able to track questions.   Thought process: linear  Thought content: No SI/HI/AVH endorsed or observed at the time of this session.  Insight: fair  Judgment: fair to poor    Plan:  To visit the director at the GPS program [employer](Nancy Gilson) on 11/08/17 to discuss academic considerations. [facility] on 11/12/17 for an evaluation for their DBT/CBT IOP. To see this provider for bridge care on 11/15/17. To  see Dr. Alonna MiniumJamie Gannon for therapy intake on 11/25/17.  If the student experiences an escalation in SI or sxs, then the plan is for her to call the ACL line, or 911, or go directly to the hospital.    Time spent in providing services:  50 minutes

## 2017-11-08 NOTE — Progress Notes (Signed)
PSYCHIATRIC E&M Follow Up    Stacy Barker  [id number]  11/08/2017  Psychiatry Specialty Clinics Established Outpatient Follow Up Visit: [id number]  PSY [place] PROF BLDG  [place] [facility]  [address]  [place] [id number]  [facility]    Attending visit: Patient   Arrived: On Time     CC:  Chief Complaint   Patient presents with    Suicidal Thinking    Depression    Substance Abuse    Anxiety    Attentional Problem       Identification: 21 year old female  Living situation/Relationship/Children:   Living in grad housing with roommate, currently not in any relationship, never married, no children  Employment:  Full time IT consultantgrad student  Significant Stressors: school, interpersonal, financial    Review of Systems   1. Constitutional: Negative  2. Eyes: Negative  3. Ears, Nose, Mouth, Throat: Negative  4. Cardiovascular: Negative  5. Respiratory: Negative  6. Gastrointestinal: Negative  7. Genitourinary: Negative  8. Musculoskeletal: Negative  9. Integumentary: Negative  10. Neurological: Negative  11. Psychiatric: Depression, Anxiety, trichotillomania  12. Endocrine: Negative  13. 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    Plan from previous visit, was: started on bupropion 100 mg Qam, continued on trintellix 20 mg daily, lamictal 50 mg daily and trazodone 50 mg daily and referred to therapist was seen for an emergency FU visit with me today after patient was [facility] on 11/28 for suicide attempt.    Pt begins with I don't know  what to do." Pt states after her last visit with me on 11/14 she went to [facility] and she was told to come back during finals week, she also could not see a therapist right away but was scheduled to see Dr. Greta DoomGanon on 12/15. She reports feeling helpless and and not getting help from the resources she was asked to contact. Pt reports having a nice Thanksgiving break but after returning this week she was handed 3 homeworks with 12 math problems in each that is due this coming Monday. Pt states she had not signed up to do math problems and doesn't want to study all day. Pt states feeling lonely and binge drinking on Wednesday after she was told by the guy she was casually dating to back off and one of her good friends said hurtful things. Pt reports feeling hopeless and posted on a close instagram chat that "This is it, I have been thinking of this for a long time, good bye." Pt states following that she tried to strangle herself but kept getting phone calls from her friends and had calmed down but then 5 police officers showed up at her door and she was [facility]. Pt states she still doesn't know what she can do. Pt states she can't drop to part-time status because of financial assistance and benefits, she does not want to quit Masters program because she may not get a job with bachelors degree, she doesn't want to go back to  live her parents and be in [place] and she feels a strong bond with her current classmates and wants to graduate with her class. Pt reports taking bupropion regularly in the morning and sleeping well but has not seen much difference in her attention. She denies active thoughts of hurting self or other but reports not knowing what to do given that she has homework due on Monday. Pt is committed to stop drinking alcohol. We discussed contacting her Tree surgeon and informing about her current mental state and pt signed ROI for [employer]. She was  also informed about [facility] and referral was placed and intake appointment scheduled for 12/4. Pt is interested in seeking care and is determined to stay with her current program. Pt will be closely FU by Rosanne Ashing and me to confirm her enrollment in IOP and transition back into school.      Progress in Treatment  Depression -   positive  Mania/ hypomania - N/A   Anxiety -   positive  Attentional problems -   positive  Psychosis - N/A  Functioning- poor trouble with school assignments    Lifestyle Habits  Diet - trying to eat healthy  Exercise -none  Sleep - okay  Substance use - as above    Current Target Symptoms anxiety, attentional problems, substance use, depression      Side Effects  None noted  Interim Medical History  No new changes    Interim Social History  Was recently let down by a friend and a guy whom she was casually dating.  PFSH  Comprehensive PFSH which was performed during a previous encounter was re-examined and reviewed with the patient. There is nothing new to add today. For details, please refer to my previous note in this chart, dated 09/18/17    School/Work Progress    Current classes: 3  Anticipated graduation date:2020    Current Relationships:  States being in toxic relationship long distance          Objective  Allergies: as reviewed by me this visit in EPIC    Medications: as reviewed by me this visit in EPIC      Vitals  Vitals:    11/08/17 1100   BP: 116/73   BP Location: Left arm   BP Patient Position: Sitting   BP cuff size: Regular   Pulse: 72   Resp: 17   Weight: 65.7 kg (144 lb 14.4 oz)   Height: 5\' 8"  (1.727 m)     Body mass index is 22.03 kg/(m^2).      Labs Reviewed: last labs dated  Psychiatric Rating Scales: none     Musculoskeletal   Ambulates with normal gait; no tremors       Mental Status Exam    Physical appearance: casually dressed, well groomed, well built  Relatedness: engaged  Eye contact: good  Attitude: cooperative  Language/Speech quality: clear with  normal rate, tone and volume  Motor behavior: animated, using a lot of hand gestures  Mood: I feel depressed"  Affect: congruent with mood, mildly constricted and ranges appropriately based on content  Thought process: linear, organized  Thought content: no evidence of psychotic process  Suicidal ideation: none  Homicidal ideation: none  Orientated to: person, place, time  Sensorium: intact  Attention/Concentration: intact  Memory: intact for recent and remote [employer] of knowledge: consistent with post secondary education  Insight: intact  Judgment: intact  Medical Decision Making    Narrative Assessment  This is a 21 year old  female with a past psychiatric history of of depression, anxiety, trichotillomania, conversion disorder single episode and anorexia, family psychiatric history of anxiety and bipolar disorder on bupropion 100 mg, lamictal 50 mg daily, trintellix 20 mg daily and trazodone 50 mg qhs, with recent suicide attempt on 11/28 [facility] and discharged was seen today for Emergency FU visit. Pt continues to report anxiety, attentional problem, depression, suicidal thinking without intent or plan in the context of school stressor. Pt was provided a letter for the Tree surgeonrogram Director and professors explaining pt's condition and [facility].  Symptoms are consistent with MDD recurrent, GAD, attention and concentration deficit, mood disorder, trichotillomania, cocaine abuse, binge drinking .      Risk Assessment  Currently does not represent an imminent danger to self or others and is not gravely disabled. Therefore does not meet criteria for a 5150 involuntary hold, or activation of this process.     DSM Diagnosis    ICD-10-CM ICD-9-CM    1. MDD (major depressive disorder), recurrent episode, moderate (CMS-HCC) F33.1 296.32 buPROPion (WELLBUTRIN) 100 MG tablet   2. Mood disorder (CMS-HCC) F39 296.90 lamoTRIgine (LAMICTAL) 25 MG tablet   3. Attention  and concentration deficit R41.840 799.51    4. GAD (generalized anxiety disorder) F41.1 300.02    5. Alcohol consumption binge drinking F10.10 305.00    6. Cocaine abuse (CMS-HCC) F14.10 305.60        INTERVENTIONS AND PLAN    Psycho-education, medication management, supportive and cognitive/behavioral psychotherapy. We discussed side effects/risks/benefits of medications in detail and alternative treatments. Patient asked questions and agreed to treatment. 30 minutes was spend in providing supportive psychotherapy for school stressors, coordinating care.    Plan:            Medical Management:    Continue trintellix 20mg  daily for mood and anxiety   Increase lamictal to 100 mg daily for mood   Continue trazodone 50mg  nightly for insomnia   Increase bupropion to 150 mg in the morning for now   Screening labs - recommended thyroid function screening labs and medical workup for fatigue                      Non-pharmacologic Strategies:    Encouraged healthy diet, exercise routines and sleep hygiene  Therapy - patient to schedule f/u with therapist.    Case Management   Appreciate coordination of services w/ other  [facility] providers   [facility], [facility]    Tried contact Verlon AuNancy Gilson at ext [phone] to coordinate care but could not get through.        Disposition  Return to clinic in 2 weeks  Return or call as needed; numbers provided for both urgent and routine contact  Continue with primary care provider as needed     CPT Code: [id number]          Marlowe AltSavita Press Casale, MD  Assistant Professor  Board Certified Psychiatry and Neurology  [facility]

## 2017-11-15 ENCOUNTER — Encounter (INDEPENDENT_AMBULATORY_CARE_PROVIDER_SITE_OTHER): Payer: 59 | Admitting: Licensed Clinical Social Worker

## 2017-11-15 NOTE — Interdisciplinary (Signed)
Progress Note:  Date of Visit: 11/15/17   Reason for Visit: Therapy/bridge care/case management  Presenting Problem:  The student has been experiencing an increase in depression sxs since beginning graduate school at [employer] at the [employer] program.The student had a recent SA and hospital admission on 11/05/17. Today the student began by stating, "I'm doing better this week. I think you could say I'm doing okay." The student remarked that she is receiving increased support from her graduate program and that has helped to manage her stress and reduce feelings of being overwhelmed. The student attended an evaluation for the [facility] on 11/12/17 but declined to enroll in the program stating that she was unable to fit it into her schedule next quarter due to it only taking place in the mornings and afternoons. The student stated that she has been able to connect with one of her professors at the [employer] program, and that this professor is involving her in a research project, and also helping her to identify possible internships. The student stated that the research involves a subject she is interested in and this has helped a great deal to increase her interest in the program. The student remarked, "I finally get to do something I really like so that helps a lot." The student stated that overall she feels more supported by her program. The student continues to endorse academic, financial, and social stressors. The student stated that [place] from [place] and that sometimes she feels as though she does not fit in here, and that belief reinforces negative social experiences she has had with peers in the past, and can trigger depression sxs. The student endorsed swings in emotions, stating "I'm either doing okay or I feel bad." The student identified triggers to substance use and triggers to depression sxs, and was able to identify coping strategies to manage those triggers. The  student was able to articulate a plan for self care over this weekend and for the next 2 weeks until she goes home for holiday break. The student denied substance use over the past week. The student denied SI over the past week and at the time of this session.     Intervention:  Assessed for safety. Practiced supportive therapy and active listening to convey empathy and build rapport. Asked the student open ended questions to help her process emotions and identify triggers. Provided psychoeducation on positive self talk strategies to begin addressing self esteem issues. Completed an "Emergency Self-Care Worksheet" to help the student have a plan in place for the next time she experiences depression sxs. Identified coping strategies to help the student manage stressors. Scheduled follow up appointments to see this writer for bridge therapy and to see her psychiatrist, Dr. Olegario MessierBhakta for med management.   Response:  The student was appreciative of this writer's efforts at support. The student denied SI at the time of session. The student was able to articulate her current challenges. The student was able to open up about negative social experiences in her past and process those emotions. The student was moderately interested in filling out the "Emergency Self-Care Worksheet," but became more engaged as the process moved forward. The student was able to identify preferred coping strategies and develop a plan for self-care over the next two weeks before she goes home on break.    MSE:  Appearance: Average height, average build, Caucasian female, well groomed, and casually dressed.   Mood: "okay"  Affect: congruent  Motor: normal  Orientation: Ox4  Eye contact: appropriate  Attitude: cooperative  Cognitive capacity: average fund of knowledge, good recall, average abstraction, able to track questions.   Thought process: linear  Thought content: The student denied SI. No SI/HI/AVH endorsed or observed at session.   Insight:  fair  Judgment: fair to poor  Plan:  To see: Verl BlalockJim Tyiesha Brackney CMHP SW on 11/21/17, Dr. Mikey CollegeGannon PhD on 11/25/17, and Dr. Olegario MessierBhakta MD on 11/27/17.   Time spent in providing services:  50 minutes

## 2017-11-21 ENCOUNTER — Encounter (INDEPENDENT_AMBULATORY_CARE_PROVIDER_SITE_OTHER): Payer: 59 | Admitting: Licensed Clinical Social Worker

## 2017-11-22 NOTE — Progress Notes (Signed)
TRAC Note  Date: 11/07/2017    Note: Clinician is a  LMFT [phone]#[id number] providing services under the direct supervision of Dr. Theodoro Doinghackaberry.      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    Assessment of Need:  When asked how she is doing, Stacy Barker replied "Im not at all like I was two days ago". She states "I was off the rails". She states that 3 years ago she used a scarf to try to hang herself but states "it was not a serious attempt". She states that she posted her intention this time on Instagram because "something inside of me couldn't do it". She states she didn't really want to kill herself she just wanted the "pain to stop". She reports that she "hates" school. She didn't do well on her mid terms and feels "stupid" compared to her friends. Reports she used to do cocaine and because of that she couldn't get the internship she wanted. Now she reports that she has "alcohol issues". When she was at [employer] she would binge drink for 3 to 4 days in bars and it was "fun". Now none of her friends want to go to bars so she will drink 8 to 10 beers or shots at home. She reports that it has helped her that a lot of her friends have reached out to support her. She reports she has an appointment tomorrow with her psychiatrist, Dr. Tommie Barker and sees her therapist on 12/17.     Intervention: Provided active listening and warm regard. Clinician provided psychotherapy using IPT interventions to facilitate engagement.  DBT Distress Tolerance skills for coping in a crisis were provided to assist with emotion regulation and safety planning. Assessed for safety. Denies SI currently.    Dispostion/Safety: Stacy Barker states that she will see her psychiatrist tomorrow. She denies sI, HI, AH, VH, no evidence of delusions, mania or paranoia currently.        Face to Face: 50 minutes  Documentation: 06 minutes

## 2017-11-25 ENCOUNTER — Telehealth (INDEPENDENT_AMBULATORY_CARE_PROVIDER_SITE_OTHER): Payer: Self-pay

## 2017-11-25 ENCOUNTER — Other Ambulatory Visit (INDEPENDENT_AMBULATORY_CARE_PROVIDER_SITE_OTHER): Payer: Self-pay | Admitting: Psychiatry

## 2017-11-25 ENCOUNTER — Ambulatory Visit (INDEPENDENT_AMBULATORY_CARE_PROVIDER_SITE_OTHER): Payer: 59 | Admitting: Clinical

## 2017-11-25 DIAGNOSIS — F1010 Alcohol abuse, uncomplicated: Secondary | ICD-10-CM

## 2017-11-25 DIAGNOSIS — F1410 Cocaine abuse, uncomplicated: Secondary | ICD-10-CM

## 2017-11-25 DIAGNOSIS — F39 Unspecified mood [affective] disorder: Principal | ICD-10-CM

## 2017-11-25 DIAGNOSIS — F330 Major depressive disorder, recurrent, mild: Secondary | ICD-10-CM

## 2017-11-25 DIAGNOSIS — G4700 Insomnia, unspecified: Secondary | ICD-10-CM

## 2017-11-25 DIAGNOSIS — F411 Generalized anxiety disorder: Secondary | ICD-10-CM

## 2017-11-25 NOTE — Telephone Encounter (Signed)
Patient left message on the refill line requesting her     Lamictal 25 mg Take 1 tab in the morning and 2 tabs at night for 7 days then take 2 tabs in the morning and 2 tabs at night      Trazodone 50 mg Take 1 tablet (50 mg) by mouth nightly      Trintellix 20 mg Take 1 tablet (20 mg) by mouth daily      Last Visit: 11/08/17  Next Visit: 11/27/17      [id number] - [place] - [address] [place] [address] AT [facility]   P: [phone]   F: [phone]   Address     [address] [place] [address]   [place] [id number]        Patient ran out of her medication yesterday.      Valinda HoarLillian Shareese Macha, MA

## 2017-11-25 NOTE — Telephone Encounter (Signed)
RN called pt   2 pt identifiers used   C/O:   Use to have IUD   1.5 had bad really bad yeast infection to the point that had to see infectious disease.   Recently has been having reoccurring yeast infections  No longer is having chunky white discharge and it is not watery white discharge.   Pr would like to know if she can be seen sooner   Future Appointments   Date Time Provider Department Center   12/12/2017  2:00 PM Mody, Esmond PlantsSheila Krishnan, MD [facility] [address]   -informed pt that there are no sooner available apts message will be sent to provider for recommendations   -advised pt to also reach out to infectious disease doctor for recommendations   --encouraged pt to call back PRN or if symptoms worsen or persist  -advised pt of ER/Pablo (precautions given for chest pain, shortness of breath, headaches, dizziness, lightheadedness, nausea, vomiting, abdominal pain, or any worsening conditions)   -Arriving 15 minutes prior to your appointment time will ensure paperwork is completed for your physician, registration is done correctly and all patients are seen in a more timely manner.  -pt verbalized complete understanding and was appreciative  -routing to MD for [facility]

## 2017-11-25 NOTE — Progress Notes (Signed)
Individual Psychotherapy: Initial Intake    Stacy EspyLauren Barker    [id number]    11/25/2017    PSY LA JOLLA PROF BLDG  [place] [facility] Riley NearingJOLLA PROF BLDG PSYCH  [address]  [place] [id number]    Referred by: ER, Dr. 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    CC: "I feel depressed at [employer]. Everyone hates it here." "I'm way stupider than everyone else." "I space out in class. Work is piling up. I hate math." "No one here drinks."      HPI:  Stacy Barker reports history of depressive symptoms beginning in 2013 after disappointment of not making band team at school.  This led to hopelessness, anhedonia, suicidal thoughts and her first inpatient hospitalization.  She indicates that she was hospitalized again in 2014 for similar symptoms and a suicide attempt.  Most recently, 11/05/17, Stacy Barker was hospitialized for trying to strangle herself with a scarf after receiving several poor grades on exams.     She reports a history of anxiety primarily related to beliefs of how others view her.  However, she endorses having "lots of friends" and being able to make many friends at [employer] since her arrival in August 2018.     Pt denies any history of ADHD, but does state that she will "space out in class" and not retain any information from the lecture.  She indicates she is able to focus outside of the lecture environment but doesn't always put in the necessary work as she doesn't care for the subjects, particularly math. Current anxiety is now focused on academic performance issues.     Pt admits to hx of anorexia in 2016, but no recurrence since.     She notes an extensive history of alcohol and cocaine use, more so in undergraduate ([employer] - would drink  7-8 drinks daily) than currently (binge drink 3-4 times per week).  She indicates her cocaine addiction is "getting better here" with last use being somewhere between two months and two days (pt contradicts herself in interview).  Although she recognizes use of cocaine and alcohol can negatively impact mood, academics, and career goals, she has not yet decided to abstain from use.   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     Pt denies any current SI/HI, mania/hypomania, symptoms of psychosis, or disordered eating.     Symptom Review  Depression - present    Mania/ hypomania -  denies  Anxiety -   present  Attentional problems -   Possible, likely tied to mood more than adhd  Disordered eating -   Not currently  Psychosis -   denies    Target Symptoms  Depression  Anxiety  Alcohol and substance use    Trauma/Violence History  Denies all    Substance Use  Alcohol -   Yes, as above     Cannabis - some use, not regularly   Amphetamines/Stimulants -  Cocaine use, as above  Other -  MDMA, club drugs in past      Psychiatric History  Past psychotherapy history -  In therapy for 2 years, several IOPS  Past psychiatry/medication history -   Currently with Dr. Olegario MessierBhakta, see her notes for info  Past psychiatric hospitalization(s) -   2013, 2014, 2016, 2018 as above  Past self-injurious behavior -   Trichotillomania in past  Past history of harm to others -   denies      Family History  Bipolar disorder -   cousin  Anxiety disorder -   aunt        Social/ Occupational/ Education/ Developmental History:  [place] was born in [place] in [place] and has one younger brother who is on spectrum. She gets along well with her parents.   Currently lives with roommates near campus and gets along well with roommates.  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  [employer].   Did well throughout school and undergrad without having to put in too much time studying.  Now, finds school "hard" and considers herself "way stupider than everyone." Unsure if she is passing courses.     Current Significant Stressors:   Academics, financial (possibly)    Self Report Measures:  None given today    MSE:  Physical appearance: appears stated age, well groomed, casually dressed  Relatedness: engaged  Eye contact: good  Attitude: cooperative  Speech quality: clear with normal rate, tone and volume  Motor behavior: sits quietly in chair  Mood: stressed" "down"  Affect: congruent with mood and ranges appropriately based on content, tearful at times  Thought process: linear, organized  Thought content: no evidence of psychotic process  Suicidal ideation: none  Homicidal ideation: none  Orientated to: person, place, time  Attention/Concentration: intact  Memory: intact for recent and remote memory  Insight: fair  Judgment: fair    Narrative Assessment:   This is a 21 year 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  Although pt asserts that she "really wants to be successful and finish graduate school" it's not clear whether she is willing to make the changes necessary to fulfill this goal. Pt's motivation will be a primary factor in therapeutic success.     Risk Assessment  Currently does not represent an imminent danger to self or others and is not gravely disabled. Therefore does not meet criteria for a 5150 involuntary hold, or activation of this process.    DSM-5 Diagnosis:    Major depressive disorder, recurrent, mild  Generalized anxiety disorder  Alcohol abuse  Stimulant use disorder, mild (cocaine)    Interventions: Establish [place], gather information and history,  psychoeducational    Plan:  1. Individual Psychotherapy:  CBT based short term (approx. 20 sessions) focused on strategies to manage mood, challenge irrational thoughts, and improve control of impulsive behaviors.   2. Collaboration with psychiatrist appreciated.  3. Referrals/Outside Providers: Pt has been referred and screened by [facility] but is not willing to go at this time as it would interfere with school schedule.       Disposition:  Return to clinic in 2-3 weeks (due to winter break)  Numbers provided for both urgent and routine contact    Face to face minutes: 45  Paperwork and review of chart: 50    Bubba Campracy Suzi Hernan, Ph.D.  [facility]   Department of Psychiatry   Psychologist

## 2017-11-25 NOTE — Telephone Encounter (Addendum)
Who is calling:Patient     What are the symptoms: Pt c/o abnormal vaginal discharge with odor for 2 months.       Pt has a future IUD insert scheduled for 12/12/2017    When did they start:2 months ago         What is the patients current insurance: [facility]     Does it require an authorization to be seen: NO      What is the best call back [phone]        If this is a red flag please contact the nurse immediately.

## 2017-11-26 MED ORDER — VORTIOXETINE HBR 20 MG PO TABS
20.0000 mg | ORAL_TABLET | Freq: Every day | ORAL | 0 refills | Status: DC
Start: 2017-11-26 — End: 2018-01-03

## 2017-11-26 MED ORDER — TRAZODONE HCL 50 MG OR TABS
50.0000 mg | ORAL_TABLET | Freq: Every evening | ORAL | 1 refills | Status: DC
Start: 2017-11-26 — End: 2018-01-29

## 2017-11-26 MED ORDER — LAMOTRIGINE 25 MG OR TABS
ORAL_TABLET | ORAL | 0 refills | Status: DC
Start: 2017-11-26 — End: 2018-01-03

## 2017-11-26 NOTE — Telephone Encounter (Signed)
Approved.  

## 2017-11-27 ENCOUNTER — Encounter (INDEPENDENT_AMBULATORY_CARE_PROVIDER_SITE_OTHER): Payer: Self-pay | Admitting: Psychiatry

## 2017-11-27 ENCOUNTER — Ambulatory Visit (INDEPENDENT_AMBULATORY_CARE_PROVIDER_SITE_OTHER): Payer: 59 | Admitting: Psychiatry

## 2017-11-27 VITALS — BP 117/67 | HR 86 | Resp 16 | Ht 68.0 in | Wt 142.0 lb

## 2017-11-27 DIAGNOSIS — F419 Anxiety disorder, unspecified: Secondary | ICD-10-CM

## 2017-11-27 DIAGNOSIS — F330 Major depressive disorder, recurrent, mild: Principal | ICD-10-CM

## 2017-11-27 DIAGNOSIS — F1010 Alcohol abuse, uncomplicated: Secondary | ICD-10-CM

## 2017-11-27 DIAGNOSIS — F1410 Cocaine abuse, uncomplicated: Secondary | ICD-10-CM

## 2017-11-27 NOTE — Progress Notes (Signed)
PSYCHIATRIC E&M Follow Up    Stacy Barker  [id number]  11/27/2017  Psychiatry Specialty Clinics Established Outpatient Follow Up Visit: [id number]  PSY LA JOLLA PROF [place] PROF [facility] [place] [id number]  Referred by: [facility]    Attending visit: Patient   Arrived: On Time     CC:  Chief Complaint   Patient presents with    Depression    Attentional Problem    Anxiety       Identification: 21 year old female  Living situation/Relationship/Children:   Living in grad housing with roommate, currently not in any relationship, never married, no children  Employment:  Full time Pharmacist, hospital Stressors: school, interpersonal, financial    Review of Systems   1. Constitutional: Negative  2. Eyes: Negative  3. Ears, Nose, Mouth, Throat: Negative  4. Cardiovascular: Negative  5. Respiratory: Negative  6. Gastrointestinal: Negative  7. Genitourinary: Negative  8. Musculoskeletal: Negative  9. Integumentary: Negative  10. Neurological: Negative  11. Psychiatric: Depression, Anxiety, trichotillomania  12. Endocrine: Negative  13. 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    Plan from previous visit, was: continued on trazodone 50 mg at bedtime, bupropion increased to 200 mg Qam, continued on trintellix 20 mg daily, lamictal increased to 50 mg BID and referred to [facility].    Pt begins with I am not doing that good." Pt states she has been off of her meds for the past 4 days. She states she had informed the pharmacy and they had sent  in request on 12/13 but she did not get any refills. Pt was informed about refill line and advised to call the refill line number in future for refills. Pt was provided prescription for all of the meds on 11/30 but pt states she did not receive it. Pt states she has been receiving lot of support from her peers and her professors and has been feeling better about herself. Pt reports she wants to be with her cohort and wants to discuss with program director what she can do to reduce her class load next quarter. Pt reports feeling worried about picking classes for next quarter. She feels she did not pay much attention in class last quarter and relied heavily on her classmates to explain to her or do homework and she felt that was sufficient for her to pass but after getting low grades in mid-terms she felt more anxious and depressed. Pt states she wants to pay attention in class. We discussed behavioral strategies to improve focus and engage in class. We also focused on medications that can help focus. Pt denies any active thoughts of hurting self or others. She denies any sleep or appetite changes. She denies any panic attacks but reports feeling overwhelmed and anxious about picking classes for next quarter. She reports having a plan to retake her tests next quarter and pairing up with another classmate who is also having to retake her tests. Pt reports drinking alcohol this past weekend but reports drinking plenty  of water and being able to walk back to her car and drive back home. She reports she will be going to [place] for the winter break and spend time with her family and meet some of her friends from [place] who will be in [place] for a festival. Pt denies any recent cocaine use.     Progress in Treatment  Depression -   positive  Mania/ hypomania - N/A   Anxiety -   positive  Attentional problems -   positive  Psychosis - N/A  Functioning- somewhat better since engaging in treatment and receiving support from  her program    Lifestyle Habits  Diet - trying to eat healthy  Exercise -none  Sleep - okay  Substance use - as above    Current Target Symptoms anxiety, attentional problems, substance use, depression      Side Effects  None noted  Interim Medical History  No new changes    Interim Social History  Spending time with her classmates. Recently went to [facility] for weekend met up with the guy whom she states is not good for her.   [place]  [facility] [place] which was performed during a previous encounter was re-examined and reviewed with the patient. There is nothing new to add today. For details, please refer to my previous note in this chart, dated 09/18/17    School/Work Progress    Current classes: 3  Anticipated graduation date:2020    Current Relationships:  States being in toxic relationship long distance          Objective  Allergies: as reviewed by me this visit in EPIC    Medications: as reviewed by me this visit in EPIC      Vitals  Vitals:    11/27/17 1519   BP: 117/67   BP Location: Left arm   BP Patient Position: Sitting   BP cuff size: Regular   Pulse: 86   Resp: 16   SpO2: 97%   Weight: 64.4 kg (142 lb)   Height: _0  (1.727 m)     Body mass index is 21.59 kg/m.      Labs Reviewed: last labs dated  Psychiatric Rating Scales: none     Musculoskeletal   Ambulates with normal gait; no tremors       Mental Status Exam    Physical appearance: casually dressed, well groomed, well built  Relatedness: engaged  Eye contact: good  Attitude: cooperative  Language/Speech quality: clear with normal rate, tone and volume  Motor behavior: animated, using a lot of hand gestures  Mood: I feel kind of not good"  Affect: congruent with mood, mildly constricted and ranges appropriately based on content  Thought process: linear, organized  Thought content: no evidence of psychotic process  Suicidal ideation: none  Homicidal ideation: none  Orientated to: person, place, time  Sensorium: intact  Attention/Concentration:  intact  Memory: intact for recent and remote [employer] of knowledge: consistent with post secondary education  Insight: intact  Judgment: intact      Medical Decision Making    Narrative Assessment  This is a 21 year old  female with a past psychiatric history of of depression, anxiety, trichotillomania, conversion disorder single episode and anorexia, family psychiatric history of anxiety and bipolar disorder on bupropion 100 mg, lamictal 50 mg daily, trintellix 20 mg daily and trazodone 50 mg qhs, with recent suicide attempt on 11/28 evaluated at [facility] ED and discharged was referred to [facility]  but refused to participate after intake evaluation at the [facility] because of timing issue with her classes was seen today for FU visit to closely monitor for symptoms. Pt continues to report minimal anxiety, attentional problem, mild depressive symptoms, in the context of school stressor. Pt saw Dr. Suezanne Cheshire this past Monday and stated she was going to look for a therapist in the community because Dr. Suezanne Cheshire will not be able to see her consistently. Pt was encouraged to schedule FU appointments with Dr. Suezanne Cheshire while awaiting for another therapy appointment with outside therapist. Pt was advised to take 75 mg of lamictal since pt had not titrated the dose up. She was also advised to continue same dose of trintellix, bupropion and trazodone. Symptoms are consistent with MDD recurrent, anxiety disorder, attention and concentration deficit, mood disorder, trichotillomania, cocaine abuse, binge drinking .      Risk Assessment  Currently does not represent an imminent danger to self or others and is not gravely disabled. Therefore does not meet criteria for a 5150 involuntary hold, or activation of this process.     DSM Diagnosis    ICD-10-CM ICD-9-CM    1. MDD (major depressive disorder), recurrent episode, mild (CMS-HCC) F33.0 296.31    2. Anxiety disorder, unspecified type F41.9 300.00    3. Alcohol consumption binge  drinking F10.10 305.00    4. Cocaine use disorder (CMS-HCC) F14.10 305.60        INTERVENTIONS AND PLAN    Psycho-education, medication management, supportive and cognitive/behavioral psychotherapy. We discussed side effects/risks/benefits of medications in detail and alternative treatments. Patient asked questions and agreed to treatment. 30 minutes was spend in providing supportive psychotherapy for school stressors, coordinating care.    Plan:            Medical Management:    Continue trintellix 48m daily for mood and anxiety   Increase lamictal to 75 mg daily for mood   Continue trazodone 586mnightly for insomnia   Increase bupropion to 150 mg in the morning for now   Screening labs - recommended thyroid function screening labs and medical workup for fatigue                      Non-pharmacologic Strategies:    Encouraged healthy diet, exercise routines and sleep hygiene  Therapy - patient to schedule f/u with Dr. AlSuezanne Cheshire   Case Management   Appreciate coordination of services w/ other [facility]   Pt has appointment with [facility] in January          Disposition  Return to clinic in 2-3 weeks  Return or call as needed; numbers provided for both urgent and routine contact  Continue with primary care provider as needed     CPT Code: 99214          SaTresa GarterMD  Assistant Professor  Board Certified Psychiatry and Neurology  [facility]

## 2017-12-12 ENCOUNTER — Encounter (INDEPENDENT_AMBULATORY_CARE_PROVIDER_SITE_OTHER): Payer: Federal, State, Local not specified - PPO | Admitting: Obstetrics & Gynecology

## 2017-12-16 ENCOUNTER — Ambulatory Visit (INDEPENDENT_AMBULATORY_CARE_PROVIDER_SITE_OTHER): Payer: Managed Care, Other (non HMO) | Admitting: Clinical

## 2017-12-16 ENCOUNTER — Other Ambulatory Visit: Payer: Managed Care, Other (non HMO) | Attending: Obstetrics & Gynecology | Admitting: Obstetrics & Gynecology

## 2017-12-16 VITALS — BP 118/71 | HR 98 | Temp 97.4°F | Ht 68.0 in | Wt 145.6 lb

## 2017-12-16 DIAGNOSIS — F1410 Cocaine abuse, uncomplicated: Secondary | ICD-10-CM

## 2017-12-16 DIAGNOSIS — Z7689 Persons encountering health services in other specified circumstances: Secondary | ICD-10-CM

## 2017-12-16 DIAGNOSIS — F330 Major depressive disorder, recurrent, mild: Secondary | ICD-10-CM

## 2017-12-16 DIAGNOSIS — B373 Candidiasis of vulva and vagina: Secondary | ICD-10-CM

## 2017-12-16 DIAGNOSIS — F411 Generalized anxiety disorder: Secondary | ICD-10-CM

## 2017-12-16 DIAGNOSIS — B3731 Acute candidiasis of vulva and vagina: Secondary | ICD-10-CM

## 2017-12-16 DIAGNOSIS — N760 Acute vaginitis: Secondary | ICD-10-CM | POA: Insufficient documentation

## 2017-12-16 DIAGNOSIS — Z113 Encounter for screening for infections with a predominantly sexual mode of transmission: Secondary | ICD-10-CM | POA: Insufficient documentation

## 2017-12-16 DIAGNOSIS — F1010 Alcohol abuse, uncomplicated: Secondary | ICD-10-CM

## 2017-12-16 MED ORDER — FLUCONAZOLE 150 MG OR TABS
150.0000 mg | ORAL_TABLET | ORAL | 1 refills | Status: DC
Start: 2017-12-16 — End: 2018-02-12

## 2017-12-16 NOTE — Patient Instructions (Signed)
You were seen for a contraceptive consult and for discussion of recurrent yeast infections. We discussed possibly trying a Paragard IUD insertion. In addition we discussed trying suppression treatment for yeast infections.

## 2017-12-16 NOTE — Progress Notes (Signed)
Contraception Consultation  12/16/2017    HPI: Stacy Barker is a 22 year old G0 who presents today for consultation regarding birth control.  She is currently using no method. She specifically wishes to address the following issues today:     1) A Contraceptive Method that does not interact with medications-Lamictal    2)Recurrent yeast infections and contraceptive methods that would decrease there risk of vaginitis. In the past she had a Skyla IUD and yeast infections. She reports being recently tested for HIV which was negative. She has not been tested for Diabetes.      BIRTH CONTROL HISTORY:  She has used Skyla in the past    -Hx of MI/stroke/HTN/DVT/clotting disorder: no  -Hx of migraines with aura: no  -Migraine without aura >/=35: no  -Hx of liver problems: no  -Current or previous cancer: no  -Current unexplained VB: no    PAST MEDICAL HISTORY:  Past Medical History:   Diagnosis Date    Anxiety     Major depressive disorder, single episode        PAST SURGICAL HISTORY:  None    OBSTETRICAL HISTORY:  G0    GYNECOLOGIC HISTORY:  Patient's last menstrual period was 12/15/2017.  Menses: regular every 28-30 days, bleeding for 5 days. Dysmenorrhea/AUB: NO  Sexual Activity: Patient is sexually active.  She has not had a new sexual partner in the last year.    Most recent contraception:  Condoms  Pap Smear History:  Date of last pap smear was last year and normal.   Sexually Transmitted Infections: NO  Sexual/Physical Abuse:  NO    SOCIAL HISTORY AND HABITS:  Social History     Socioeconomic History    Marital status: Single     Spouse name: Not on file    Number of children: Not on file    Years of education: Not on file    Highest education level: Not on file   Social Needs    Financial resource strain: Not on file    Food insecurity - worry: Not on file    Food insecurity - inability: Not on file    Transportation needs - medical: Not on file    Transportation needs - non-medical: Not on file    Occupational History    Not on file   Tobacco Use    Smoking status: Never Smoker    Smokeless tobacco: Never Used   Substance and Sexual Activity    Alcohol use: Yes     Alcohol/week: 144.0 oz     Types: 12 Cans of beer per week     Comment: Pt states drinks appx 3/week- beer and liquor    Drug use: Not on file    Sexual activity: Not Currently   Other Topics Concern    Not on file   Social History Narrative    Not on file       FAMILY HISTORY:  Family History   Problem Relation Name Age of Onset    Diabetes Mother      Diabetes Father      Psychiatry Paternal Aunt      Psychiatry [place]in       -Fam hx of blood clots: no    ALLERGIES:  No Known Allergies    MEDICATIONS:  Current Outpatient Medications on File Prior to Visit   Medication Sig Dispense Refill    buPROPion (WELLBUTRIN) 100 MG tablet Take 1.5 tablets (150 mg) by mouth daily. 45 tablet 0  lamoTRIgine (LAMICTAL) 25 MG tablet Take 1 tab in the morning and 2 tabs at night for 7 days then take 2 tabs in the morning and 2 tabs at night 120 tablet 0    traZODone (DESYREL) 50 MG tablet Take 1 tablet (50 mg) by mouth nightly. 30 tablet 1    vortioxetine (TRINTELLIX) 20 MG tablet Take 1 tablet (20 mg) by mouth daily. 30 tablet 0     No current facility-administered medications on file prior to visit.        REVIEW OF SYSTEMS:   A review of systems was completed and is negative, except for the pertinent positives and negatives as noted above in the history of present illness.      PHYSICAL EXAM:  BP 118/71 (BP Location: Left arm, BP Patient Position: Sitting, BP cuff size: Regular)    Pulse 98    Temp 97.4 F (36.3 C) (Oral)    Ht 5\' 8"  (1.727 m)    Wt 66 kg (145 lb 9.6 oz)    LMP 12/15/2017    BMI 22.14 kg/m   General:  This is a well appearing, female in no apparent distress.   Skin:  Warm, dry and no rashes or lesions.  HEENT: Normocephalic.  Sclera anicteric.  Extraocular muscles intact.  Lungs:  Non labored  Abdomen:  Soft. Non-tender.     Musculoskeletal:  Normal.   Extremities:  No edema.  WWP.  Neurological:  Normal    PELVIC EXAM:  External Genitalia:  Normal contour. No lesions.   Urethral Meatus:  Normal in appearance.  Vagina:  Well estrogenized.  Well rugated. No lesions.  Cervix:  No lesions.  normal in appearance  No distinctive discharge seen.Vaginal swab taken.    IN OFFICE LABORATORY FINDINGS:      Pregnancy Test:Neg    ASSESSMENT: This is a 22 year old female who presents today for consultation regarding her birth control options.    PLAN:   #  Birth control: Detailed discussion held regarding appropriate birth control options for this patient, including the efficicacies, risks, benefits, and alternatives for each method. LARC methods discussed in detail.  We discussed avoiding estrogen containing method given given this would decrease her Lamictal level.     After all questions were answered the pt opts for Paragard IUD    # Recurrent Yeast infections  -per pt request sent a vaginosis screen today  -Prescribed Fluconazole suppression treatment    # STI screening: GC/CT DNA probe was sent today.  Discussion was held with the patient that condoms is the only contraceptive option that can help decrease the risk of STIs.    # Health maintenance: Pt is up-to-date with her pap smear and therefore a pap was not sent today.    # PCP referral placed    Patient visit > 30 minute with >50% dedicated to face-to-face counseling.  Lacy Duverney MD MPH

## 2017-12-16 NOTE — Progress Notes (Signed)
Individual Psychotherapy: Follow Up  Amazing Cowman    [id number]    [facility] [place] [facility]  [place] [facility] PSYCH  [address]  [place] [address]    Referred by: ER, Dr. 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    Interim History and Current Concerns:  Stacy Barker arrived on time for first follow up session.   She discussed her trip home to [facility] for the holidays as well as a side trip to [facility] with some friends.  Pt was disturbed that "a lot" of her friends from [employer] had "unfriended" her and chose to disconnect from her social media.  Pt indicated that this has been a pattern of "losing friends" since she was a young girl and was perceived by others to be "weird and different."   Pt stated that her recent [place] friend group told her that she was "too much" and that they didn't care for her "drama" or "arguing."    Pt noted that she feels as if she's closer to her cohort at [employer] than friends in [place] as she can "have discussions" with people here.  Pt was able to use MI and CBT to pose possibility that [place] friends were envious of her upward movement in life while most of them continued to drink excessively, struggle in school, and/or work in bars.  Pt continues to have difficult time believing she has accomplished anything.   She will return in 1-2 weeks.     Stacy Barker  Depression  Anxiety  Alcohol and substance use    MSE:  Physical appearance: appears stated age, well groomed, casually dressed in black track suit  Relatedness: engaged  Eye contact: good  Attitude: cooperative  Speech quality: clear with normal rate, tone and volume  Motor behavior: sits quietly in chair  Mood: okay"  Affect: congruent with  mood and ranges appropriately based on content, on verge of tears at times  Thought process: linear, organized  Thought content: no evidence of psychotic process  Suicidal ideation: none  Homicidal ideation: none  Orientated to: person, place, time  Attention/Concentration: intact  Memory: intact for recent and remote memory  Insight: fair  Judgment: fair    Narrative Assessment:   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  Although pt asserts that she "really wants to be successful and finish graduate school" it's not clear whether she is willing to make the changes necessary to fulfill this goal. Pt's motivation will be a primary factor in therapeutic success.     Risk Assessment  Currently does not represent an imminent danger to self or others and is not gravely disabled. Therefore does not meet criteria for a 5150 involuntary hold, or activation of this process.    DSM-5 Diagnosis:    Major depressive disorder, recurrent, mild  Generalized anxiety disorder  Alcohol abuse  Stimulant use disorder, mild (cocaine)    Interventions: gather information and history, CBT, MI, psychoeducational    Plan:  1. Individual Psychotherapy:  CBT based short term (approx. 20 sessions)  focused on strategies to manage mood, challenge irrational thoughts, and improve control of impulsive behaviors.   2. Collaboration with psychiatrist appreciated.  3. Referrals/Outside Providers: Pt has been referred and screened by [facility] but is not willing to go at this time as it would interfere with school schedule.       Disposition:  Return to clinic in 1-2 weeks.  Numbers provided for both urgent and routine contact    Face to face minutes: 45  Paperwork and review of chart: 20    Stacy Barker, Ph.D.  [facility]  Outpatient   Department of Psychiatry   Psychologist

## 2017-12-17 LAB — VAGINOSIS SCREEN
Candida, Vaginal Screen: NEGATIVE
Gardnerella, Vaginal Screen: NEGATIVE
Trichomonas, Vaginal Screen: NEGATIVE

## 2017-12-17 LAB — CHLAMYDIA/GONORRHEA PCR, GENITAL
Chlamydia trachomatis PCR: NOT DETECTED
Neisseria gonorrhoeae PCR: NOT DETECTED

## 2018-01-03 ENCOUNTER — Encounter (INDEPENDENT_AMBULATORY_CARE_PROVIDER_SITE_OTHER): Payer: Self-pay | Admitting: Psychiatry

## 2018-01-03 ENCOUNTER — Ambulatory Visit (INDEPENDENT_AMBULATORY_CARE_PROVIDER_SITE_OTHER): Payer: Managed Care, Other (non HMO) | Admitting: Psychiatry

## 2018-01-03 VITALS — BP 133/84 | HR 89 | Ht 68.0 in | Wt 140.2 lb

## 2018-01-03 DIAGNOSIS — F419 Anxiety disorder, unspecified: Secondary | ICD-10-CM

## 2018-01-03 DIAGNOSIS — F1410 Cocaine abuse, uncomplicated: Secondary | ICD-10-CM

## 2018-01-03 DIAGNOSIS — R41840 Attention and concentration deficit: Secondary | ICD-10-CM

## 2018-01-03 DIAGNOSIS — G4700 Insomnia, unspecified: Secondary | ICD-10-CM

## 2018-01-03 DIAGNOSIS — F330 Major depressive disorder, recurrent, mild: Principal | ICD-10-CM

## 2018-01-03 DIAGNOSIS — F1010 Alcohol abuse, uncomplicated: Secondary | ICD-10-CM

## 2018-01-03 MED ORDER — BUPROPION XL (DAILY) 150 MG OR TB24
150.0000 mg | ORAL_TABLET | Freq: Every morning | ORAL | 1 refills | Status: DC
Start: 2018-01-03 — End: 2019-06-10

## 2018-01-03 MED ORDER — LAMOTRIGINE ER 100 MG PO TB24
100.0000 mg | ORAL_TABLET | Freq: Every evening | ORAL | 1 refills | Status: AC
Start: 2018-01-03 — End: ?

## 2018-01-03 MED ORDER — VORTIOXETINE HBR 20 MG PO TABS
20.0000 mg | ORAL_TABLET | Freq: Every day | ORAL | 2 refills | Status: AC
Start: 2018-01-03 — End: ?

## 2018-01-03 NOTE — Progress Notes (Signed)
PSYCHIATRIC E&M Follow Up    Stacy Barker  [id number]  01/03/2018  Psychiatry Specialty Clinics Established Outpatient Follow Up Visit: [id number]  PSY [place] [facility]  [place] [facility] PSYCH  [address]  [place] [address]  Referred by: [facility]    Attending visit: Patient   Arrived: On Time     CC:  Chief Complaint   Patient presents with    Depression    Mood symptoms    Anxiety    Attentional Problem       Identification: 22 year old female  Living situation/Relationship/Children:   Living in grad housing with roommate, currently not in any relationship, never married, no children  Employment:  Full time Psychologist, clinical Stressors: school, interpersonal, financial    Review of Systems   1. Constitutional: Negative  2. Eyes: Negative  3. Ears, Nose, Mouth, Throat: Negative  4. Cardiovascular: Negative  5. Respiratory: Negative  6. Gastrointestinal: Negative  7. Genitourinary: Negative  8. Musculoskeletal: Negative  9. Integumentary: Negative  10. Neurological: Negative  11. Psychiatric: Depression, Anxiety, trichotillomania  12. Endocrine: Negative  13. Hematologic/Lymphatic: Negative       Interim History and Current Symptoms    This is a 22 year old  female with a past psychiatric history of depression, anxiety, trichotillomania, conversion disorder single episode and anorexia and no past medical history who presented with minimal anxiety in the context of school and interpersonal stressor was diagnosed with MDD recurrent in partial remission, GAD, Trichotillomania, attention and concentration deficit and mood disorder unspecified .    Plan from previous visit, was: continued on trazodone 50 mg at bedtime, bupropion increased to 200 mg Qam, continued on trintellix 20 mg daily, lamictal increased to 50 mg BID and referred to [facility].    Pt begins with I am feeling much better, I am done with all the homework from last quarter and I can starting working on this quarter courses  now." Pt states she is still on academic probation because of her GPA of 2.92 last quarter but she states with her current assignments turned in and waiting for one more course that she took the exam now she hopes to get a GPA above 3.0. Pt states that she has been focusing on herself and getting good sleep. She reports getting her stuff done on time is also helping her not get behind and feel anxious. She reports bupropion 100 mg has been helping her focus and she thinks it is working very well. She is currently only taking one tab in the morning. SHe reports still taking lamictal 25 mg 1 tab in the morning and 2 tabs in the evening. She reports having intermittent thoughts of dying and hurting herself to be done with all the pain of going through school but is able to talk herself out of that thought and focus on herself. She states she is done with [place] and doesn't want to go back there. She reports having a good time with her family and her friends from [place] visiting her in [place] over the break. She denies any active thoughts of hurting self or others. She reports engaging in therapy and doing well so far. She denies any sleep or appetite changes. She reports getting atleast 8 hours of sleep everyday. She reports taking trazodone every night and takes trintellix 20 mg daily. Pt reports several days a week having trouble getting out of bed and pt recognizes  that as not healthy behavior. We discussed going up on the dose of wellbutrin and lamictal and changing to extended release formulation for both. Pt denied any SEs with her current medications. She denies any recent substance use. .     Progress in Treatment  Depression -   See above  Mania/ hypomania - N/A   Anxiety -   positive  Attentional problems -   Able to focus better with bupropion  Psychosis - N/A  Functioning- somewhat better since engaging in treatment and receiving support from her program    Lifestyle Habits  Diet - trying to eat healthy  Exercise  -none  Sleep - okay  Substance use - as above    Current Target Symptoms anxiety, attentional problems, substance use, depression      Side Effects  None noted  Interim Medical History  No new changes    Interim Social History  Went to [place] for winter break and spend time with family and her friends from [employer] who were visiting [place] for a music festival.  [facility]  Comprehensive PFSH which was performed during a previous encounter was re-examined and reviewed with the patient. There is nothing new to add today. For details, please refer to my previous note in this chart, dated 09/18/17    School/Work Progress    Current classes: 4 (Accounting, quantative methods 2, japanese and Agricultural consultantpolicy making)  Anticipated graduation date:2020    Current Relationships:  States being in toxic relationship long distance          Objective  Allergies: as reviewed by me this visit in EPIC    Medications: as reviewed by me this visit in EPIC      Vitals  Vitals:    01/03/18 1329   BP: 133/84   BP Location: Left arm   BP Patient Position: Sitting   BP cuff size: Regular   Pulse: 89   Weight: 63.6 kg (140 lb 3.2 oz)   Height: 5\' 8"  (1.727 m)     Body mass index is 21.32 kg/m.      Labs Reviewed: last labs dated  Psychiatric Rating Scales: none     Musculoskeletal   Ambulates with normal gait; no tremors       Mental Status Exam    Physical appearance: casually dressed, well groomed, well built, wearing refractory glasses  Relatedness: engaged  Eye contact: good  Attitude: cooperative  Language/Speech quality: clear with normal rate, tone and volume  Motor behavior: animated, using a lot of hand gestures  Mood: I feel good"  Affect: congruent with mood, and ranges appropriately based on content  Thought process: linear, organized  Thought content: no evidence of psychotic process  Suicidal ideation: none  Homicidal ideation: none  Orientated to: person, place, time  Sensorium: intact  Attention/Concentration: intact  Memory: intact for recent  and remote [employer]memory  Fund of knowledge: consistent with post secondary education  Insight: intact  Judgment: intact      Medical Decision Making    Narrative Assessment  This is a 22 year old  female with a past psychiatric history of of depression, anxiety, trichotillomania, conversion disorder single episode and anorexia, family psychiatric history of anxiety and bipolar disorder on bupropion 100 mg, lamictal 50 mg daily, trintellix 20 mg daily and trazodone 50 mg qhs, with recent suicide attempt on 11/28 [facility] and [facility] was [facility] but [facility] to participate [facility] because of  timing issue with her classes was seen today for FU visit to closely monitor for symptoms. Pt continues to report minimal anxiety, attentional problem, mild depressive symptoms, in the context of school stressor.  Pt was encouraged to schedule FU appointments with Dr. Thera Flake. Pt was advised to take 100 mg of lamictal ER and bupropion XL 150 mg in the morning. She was also advised to continue same dose of trintellix, and trazodone as above. Symptoms are consistent with MDD recurrent, anxiety disorder, attention and concentration deficit, mood disorder, trichotillomania, cocaine abuse, binge drinking .      Risk Assessment  Currently does not represent an imminent danger to self or others and is not gravely disabled. Therefore does not meet criteria for a 5150 involuntary hold, or activation of this process.     DSM Diagnosis    ICD-10-CM ICD-9-CM    1. MDD (major depressive disorder), recurrent episode, mild (CMS-HCC) F33.0 296.31 vortioxetine (TRINTELLIX) 20 MG tablet   2. Mood disorder (CMS-HCC) F39 296.90 LamoTRIgine ER 100 MG TB24      buPROPion (WELLBUTRIN XL) 150 MG XL tablet   3. Attention deficit hyperactivity disorder (ADHD), predominantly inattentive type F90.0 314.00 buPROPion (WELLBUTRIN XL) 150 MG XL tablet   4. Anxiety disorder, unspecified type F41.9 300.00     5. Insomnia, unspecified type G47.00 780.52        INTERVENTIONS AND PLAN    Psycho-education, medication management, supportive and cognitive/behavioral psychotherapy. We discussed side effects/risks/benefits of medications in detail and alternative treatments. Patient asked questions and agreed to treatment. 25 minutes was spend in providing supportive psychotherapy for school stressors.    Plan:            Medical Management:    Continue trintellix 20mg  daily for mood and anxiety   Increase lamictal to 100 mg HS and switch to ER formulation for mood   Continue trazodone 50mg  nightly for insomnia   Increase bupropion to 150 mg XL formulation in the morning for now   Screening labs - recommended thyroid function screening labs and medical workup for fatigue                      Non-pharmacologic Strategies:    Encouraged healthy diet, exercise routines and sleep hygiene  Therapy - patient to schedule f/u with Dr. Thera Flake.    Case Management   Appreciate coordination of services w/ other [facility] providers   Pt has appointment with [facility] in January          Disposition  Return to clinic in 4-6 weeks  Return or call as needed; numbers provided for both urgent and routine contact  Continue with primary care provider as needed     CPT Code: 16109          Marlowe Alt, MD  Assistant Professor  Board Certified Psychiatry and Neurology  [facility]

## 2018-01-07 ENCOUNTER — Encounter (INDEPENDENT_AMBULATORY_CARE_PROVIDER_SITE_OTHER): Payer: Managed Care, Other (non HMO) | Admitting: Clinical

## 2018-01-07 NOTE — Progress Notes (Deleted)
The patient missed the appointment.

## 2018-01-09 ENCOUNTER — Encounter (INDEPENDENT_AMBULATORY_CARE_PROVIDER_SITE_OTHER): Payer: Self-pay | Admitting: Obstetrics & Gynecology

## 2018-01-09 ENCOUNTER — Other Ambulatory Visit: Payer: Managed Care, Other (non HMO) | Attending: Obstetrics & Gynecology | Admitting: Obstetrics & Gynecology

## 2018-01-09 VITALS — BP 129/78 | HR 83 | Temp 98.1°F | Ht 68.0 in | Wt 140.0 lb

## 2018-01-09 DIAGNOSIS — Z3202 Encounter for pregnancy test, result negative: Secondary | ICD-10-CM

## 2018-01-09 DIAGNOSIS — Z30430 Encounter for insertion of intrauterine contraceptive device: Secondary | ICD-10-CM | POA: Insufficient documentation

## 2018-01-09 DIAGNOSIS — N898 Other specified noninflammatory disorders of vagina: Principal | ICD-10-CM | POA: Insufficient documentation

## 2018-01-09 MED ORDER — PARAGARD INTRAUTERINE COPPER IU IUD
1.00 | INTRAUTERINE_SYSTEM | Freq: Once | INTRAUTERINE | Status: AC
Start: 2018-01-09 — End: 2018-01-09
  Administered 2018-01-09: 1 via INTRAUTERINE

## 2018-01-09 NOTE — Interdisciplinary (Signed)
Negative pregnancy test. Internal QC visible.

## 2018-01-09 NOTE — Progress Notes (Signed)
IUD Insertion    Stacy Barker is a 22 year old [place] presents for Paragard IUD insertion. She had a Skyla in the past and did not like it. She reports slight discharge.   Current BCM: none    Reviewed alternative methods of contraception available and patient elects for a Paragard IUD.     LMP: Patient's last menstrual period was 12/15/2017.  Unprotected intercourse since last menses? NO  Pregnancy Test:Neg  GC/CT: not indicated    I have reviewed patient's medical history and she has has no contraindications to Paragard IUD.    A complete discussion of the risks and benefits of IUD use and the details of the insertion procedure was held with the patient. Discussed risks of bleeding, infection, perforation, expulsion, failure (increased risk of ectopic), irregular bleeding patterns, pain with insertion, vasovagal reaction. All questions were answered.  A consent form was signed.      Time Out: performed at  3:45pm  Correct patient:  yes  Correct procedure:  yes  Correct equipment and/or implants:  yes   Patients allergies were confirmed.       Exam:   01/09/18  1539   BP: 121/73   Pulse: 98   Temp: 98.1 F (36.7 C)   Gen: alert, pleasant, no apparent distress   Uterus: normal sized and anterior  Adnexae:  no masses or tenderness    Speculum inserted in vagina and cervix visualized. Scant white discharge.  Cervix cleansed with Betadine. 20 cc 1% lidocaine paracervical block placed.  Anterior lip of cervix grasped with single tooth tenaculum.  Uterus sounded to 7 cm.  Paragard IUD inserted using sterile technique.  Strings trimmed to 3 cm.  Tenaculum removed and tenaculum sites hemostatic with silver nitrate.  Patient stable after procedure.    Assessment and plan:     1) Encounter for IUD insertion  - uncomplicated IUD insertion  - reviewed efficacy, possible bleeding patterns and recommended self-check of strings after each menses  - instructed to use back up method x 7 days if applicable  - counseled  IUD does  not provide STI protection and that should be combined with condom use if at risk.    2) Vaginal discharge  -Vaginosis screen sent today    Patient visit > 30 minutes with >50% dedicated to face-to-face counseling.   Lanice SchwabSheila Krishnan Blaiden Werth, MD

## 2018-01-09 NOTE — Patient Instructions (Signed)
Intrauterine Device (IUD) Insertion: After Your Visit    Your Care Instructions  The intrauterine device (IUD) is a very effective method of birth control. It is a small, plastic, T-shaped device that contains copper or hormones. The doctor inserts the IUD into your uterus. A plastic string tied to the end of the IUD hangs down through the cervix into the vagina (but not out of your vagina).    There are two types of IUDs. The copper IUD is effective for up to 10 years. The hormonal IUD is effective for either 3 years or 5 years, depending on which IUD is used. The hormonal IUD also reduces menstrual bleeding and cramping. Both types of IUD  affects the ability of the sperm to move toward the egg. This means that the woman's egg does not join with the sperm. The hormonal IUD also keeps ovaries from releasing eggs and thickens mucus in the cervix which keeps sperm from reaching the uterus and traveling to an ovary to meet an egg. An IUD also affects the ability of the sperm to move toward the egg.      Follow-up care is a key part of your treatment and safety. Be sure to make and go to all appointments, and call your doctor if you are having problems. It's also a good idea to know your test results and keep a list of the medicines you take.      When does the IUD start working?  · Copper IUD (Paragard): Starts working immediately after placement  · Mirena and Skyla: start working after 7 days. Be sure to use backup birth control (like a condom) for the first 7 days.      How can you care for yourself at home?  You may experience some mild cramping and light bleeding (spotting) for 1 or 2 days. Use a hot water bottle or a heating pad set on low on your belly for pain.   Take an over-the-counter pain medicine, such as acetaminophen (Tylenol), ibuprofen (Advil, Motrin), and naproxen (Aleve) if needed. Read and follow all instructions on the label.     If you are able to take NSAIDs (ibuprofen or naproxen), it can be  helpful                    to take these medications on a scheduled basis for the first 5 days after                     insertion. Take with food/milk. Suggested regimens include:                                     Naproxen 440mg (two 220mg tabs) twice daily for 5 days                                     Ibuprofen 600mg (three 200mg tabs) every 6 hours for 5 days                                     Ibuprofen 800mg (four 200mg tabs) every 8 hours for 5 days        If you cannot take NSAIDs, you can take acetaminophen (Tylenol):                                       Acetaminophen 650-1000mg every 8 hours (do not exceed            3250mg of acetaminophen in a 24 hour time period)  You can check the string of your IUD periodically. To check for strings, wash your hands. Then, sit or squat down. Place one finger into your vagina until you feel your cervix which is at the top of your vagina. It will feel hard and rubbery, like the end of your nose. The string ends should be coming through your cervix. Do not pull on the strings. If the strings feel much longer than before, if you feel the hard plastic part of the IUD, or if you cannot feel the strings at all, the IUD may have moved out of place. Please call the clinic and consider using a back up form of birth control until you are seen.If the IUD comes out, save it and call your doctor. Be sure to use another form of birth control while the IUD is out.   Use latex condoms to protect against sexually transmitted infections (STIs), such as gonorrhea and chlamydia. An IUD does not protect you from STIs. Having one sex partner (who does not have STIs and does not have sex with anyone else) is a good way to avoid STIs.   Pregnancy is unlikely after IUD placement, but can happen. Please call the clinic if you have any concerns or if your pregnancy test is positive.      It is normal to have changes to your menstrual period after having the IUD placed.   · Copper IUD  (Paragard): Most women have more bleeding and cramping with their periods when they first get the Copper T IUD, but this usually lessens over time. Using a NSAID (ibuprofen or naproxen) for the first 3-5 days of your menstrual period can help decrease the bleeding and cramping. Be sure to take the NSAID with food/milk. Suggested regimens include:   Naproxen 440mg (two 220mg tabs) twice daily for 3-5 days        Ibuprofen 600mg (three 200mg tabs) every 6 hours for 3-5 days   Ibuprofen 800mg (four 200mg tabs) every 8 hours for 3-5 days  · Mirena: irregular bleeding and spotting is normal for the first 4-6 months after the IUD is placed.  This bleeding can be bothersome at first but usually will become lighter over time. After 6 months, your bleeding should be 80-90% lighter. Call the clinic if your bleeding is excessive and not getting better. Approximately 30% of women will stop having menstrual bleeding after 1 year of use.   · Skyla: irregular bleeding and spotting is normal for the first 4-6 months after the IUD is placed.  This bleeding can be bothersome at first but usually will become lighter over time. After 6 months, your menstrual period will be regular, but lighter. Only 6% of women will stop having a menstrual period after 1 year of use.       When should you call for help?  Call 911 anytime you think you may need emergency care. For example, call if:  You passed out (lost consciousness).   You have sudden, severe pain in your belly or pelvis.      Call your doctor now or seek immediate medical care if:  You have new belly or pelvic pain.    You have severe vaginal bleeding. You are passing clots of blood and soaking through your usual pads or tampons every hour for 2   or more hours.   You are dizzy or lightheaded, or you feel like you may faint.   You have a fever and pelvic pain or vaginal discharge.   You have pelvic pain/cramping that is getting worse.    Watch closely for changes in your health, and be  sure to contact your doctor if:  You cannot feel the string, or the IUD comes out.   You feel sick to your stomach, or you vomit.   You think you may be pregnant      To speak to a nurse or physician, you may also call our clinic during normal business hours:   [facility] ([place]) at [phone]    [facility] ([place]) at [phone].    For emergencies after hours or weekends, call [phone] ([place] operator) and ask to speak to the resident physician on call for Gynecology.

## 2018-01-09 NOTE — Addendum Note (Signed)
Addended by: MODYVelna Hatchet, Leam Madero on: 01/09/2018 04:57 PM     Modules accepted: Orders

## 2018-01-11 LAB — VAGINOSIS SCREEN
Candida, Vaginal Screen: POSITIVE — AB
Gardnerella, Vaginal Screen: POSITIVE — AB
Trichomonas, Vaginal Screen: NEGATIVE

## 2018-01-29 ENCOUNTER — Other Ambulatory Visit (INDEPENDENT_AMBULATORY_CARE_PROVIDER_SITE_OTHER): Payer: Self-pay | Admitting: Psychiatry

## 2018-01-29 DIAGNOSIS — G4700 Insomnia, unspecified: Principal | ICD-10-CM

## 2018-01-29 MED ORDER — TRAZODONE HCL 50 MG OR TABS
50.0000 mg | ORAL_TABLET | Freq: Every evening | ORAL | 1 refills | Status: AC
Start: 2018-01-29 — End: ?

## 2018-01-29 NOTE — Telephone Encounter (Signed)
Refill request from patient for Trazodone 50mg  #30 one tab po qhs.     Last visit: 01/03/2018  No follow up visit as of today. Left VM to schedule appt.     [facility] [phone] - [place] - [address] [place] AT [facility]

## 2018-02-12 ENCOUNTER — Encounter (INDEPENDENT_AMBULATORY_CARE_PROVIDER_SITE_OTHER): Payer: Self-pay | Admitting: Student in an Organized Health Care Education/Training Program

## 2018-02-12 ENCOUNTER — Ambulatory Visit (INDEPENDENT_AMBULATORY_CARE_PROVIDER_SITE_OTHER): Payer: Managed Care, Other (non HMO) | Admitting: Student in an Organized Health Care Education/Training Program

## 2018-02-12 ENCOUNTER — Telehealth (INDEPENDENT_AMBULATORY_CARE_PROVIDER_SITE_OTHER): Payer: Self-pay | Admitting: Obstetrics & Gynecology

## 2018-02-12 ENCOUNTER — Other Ambulatory Visit: Payer: Managed Care, Other (non HMO) | Attending: Internal Medicine

## 2018-02-12 VITALS — BP 118/74 | HR 68 | Temp 98.2°F | Resp 16 | Ht 68.0 in | Wt 140.0 lb

## 2018-02-12 DIAGNOSIS — Z0100 Encounter for examination of eyes and vision without abnormal findings: Secondary | ICD-10-CM

## 2018-02-12 DIAGNOSIS — Z0000 Encounter for general adult medical examination without abnormal findings: Principal | ICD-10-CM

## 2018-02-12 DIAGNOSIS — Z0189 Encounter for other specified special examinations: Secondary | ICD-10-CM

## 2018-02-12 DIAGNOSIS — B079 Viral wart, unspecified: Secondary | ICD-10-CM

## 2018-02-12 DIAGNOSIS — Z113 Encounter for screening for infections with a predominantly sexual mode of transmission: Secondary | ICD-10-CM | POA: Insufficient documentation

## 2018-02-12 DIAGNOSIS — F329 Major depressive disorder, single episode, unspecified: Secondary | ICD-10-CM

## 2018-02-12 DIAGNOSIS — Z1331 Encounter for screening for depression: Secondary | ICD-10-CM

## 2018-02-12 DIAGNOSIS — F32A Depression, unspecified: Secondary | ICD-10-CM

## 2018-02-12 LAB — COMPREHENSIVE METABOLIC PANEL, BLOOD
ALT (SGPT): 12 U/L (ref 0–33)
AST (SGOT): 17 U/L (ref 0–32)
Albumin: 4.7 g/dL (ref 3.5–5.2)
Alkaline Phos: 57 U/L (ref 35–140)
Anion Gap: 15 mmol/L (ref 7–15)
BUN: 13 mg/dL (ref 6–20)
Bicarbonate: 23 mmol/L (ref 22–29)
Bilirubin, Tot: 0.33 mg/dL (ref <1.2)
Calcium: 9.3 mg/dL (ref 8.5–10.6)
Chloride: 104 mmol/L (ref 98–107)
Creatinine: 0.79 mg/dL (ref 0.51–0.95)
GFR: >60 mL/min
Glucose: 80 mg/dL (ref 70–99)
Potassium: 4.2 mmol/L (ref 3.5–5.1)
Sodium: 142 mmol/L (ref 136–145)
Total Protein: 7.4 g/dL (ref 6.0–8.0)

## 2018-02-12 LAB — GLYCOSYLATED HGB(A1C), BLOOD: Glyco Hgb (A1C): 4.7 % — ABNORMAL LOW (ref 4.8–5.8)

## 2018-02-12 LAB — HEPATITIS C AB, BLOOD: Hepatitis C Ab: NONREACTIVE

## 2018-02-12 LAB — HEPATITIS B SURFACE AB, QUANT, BLOOD: HBsAb,Qt: <3.5 m[IU]/mL

## 2018-02-12 LAB — HEPATITIS B SURFACE AG, BLOOD: HBsAg: NONREACTIVE

## 2018-02-12 LAB — HEPATITIS B CORE AB TOTAL: HBcAb Total: NONREACTIVE

## 2018-02-12 NOTE — Progress Notes (Signed)
[facility]: NEW PATIENT NOTE     CC: Establish care    SUBJECTIVE   HPI:  Stacy EspyLauren Barker is a 22 year old female with significant major depressive disorder managed by psychiatry here to establish care.     #MDD and anxiety  Takes wellbutrin, lamotrigine and Trintellix. Trazodone for sleep. Patient had a PHQ score 13, primarily due to impaired concentration in lectures and difficulty sleeping. In class she says she thinks about shopping and getting her nails done, and has to work harder at home to make up for it. She feels that her mood is well controlled and has no SI. She is seeing a Therapist, sportssychiatrist in 1 month outside of [place] as insurance wont cover her [facility] psychiatrist anymore.    #Social history   Endorses history of cocaine use. 1-2x per week for 2 years prior to [place] to [facility] [place] in [place]. Reports that she has stopped. She also has history of binge drinking and drank more heavily prior to starting grad school. She would drink on the weekends about 7 drinks a night. She says that she realizes this is a problem and has cut back a lot since [place] [facility] [place]. She is also sexually active with multiple partners and has had Chlamydia in the past. Was seen by Gyn for IUD placement last month and tested negative for GC. She would like the HPV vaccine. She has never had a pap smear but would like one, however she is having menses today.     #Family history of diabetes type 1 and type 2   She requests screening for diabetes due to family history. Denies symptoms of fatigue, weight loss, urinary frequency.     #Warts of fingers   Has been trying over the counter freezing treatments with no benefit. Would like to see a dermatologist.     Review of Systems:  (Positives in bold)  Constitutional: fevers, chills, sweats, fatigue, weight loss, weight gain, insomnia  HEENT: vision changes, eye discharge, dizziness, HA, hearing loss, rhinorrhea, sore throat, ear pain, ear discharge, voice changes  CV: CP,  palpitations, orthopnea, DOE, PND, peripheral edema  Resp: SOB, cough, wheezing, hemoptysis, severe snoring  GI: Anorexia, nausea, vomiting, diarrhea, constipation, abdominal pain, bloody stool, dysphagia, heart burn, abd swelling, tarry stools, jaundice  GU: Dysuria, hesitancy, frequency, urgency, hematuria, incomplete emptying  MSK: weakness, joint pain, joint swelling, decreased ROM, muscle tenderness  Neuro: dizziness, weakness, numbness, tingling, syncope, bladder/bowel incont  Skin: rash, new growths, itching, non-healing sores, changing lesions  Psych: SI, HI, depression, anxiety    Past Medical History:   Diagnosis Date    Anxiety 2013    Major depressive disorder, single episode 2013     Past Surgical History:   Procedure Laterality Date    NO PAST SURGERIES       Family History   Problem Relation Name Age of Onset    No Known Problems Mother      No Known Problems Father      Depression Paternal Aunt      Bipolar Disorder MCousin      No Known Problems Brother      Lung Cancer Maternal Grandfather  7980        smoker     Diabetes Paternal Grandmother          type 1     Heart Disease Paternal Grandmother  3985    Diabetes Paternal Uncle  type 1    Diabetes Maternal Aunt          type 2    Breast Cancer Neg Hx      Ovarian Cancer Neg Hx      Colorectal Cancer Neg Hx       Social History     Socioeconomic History    Marital status: Single     Spouse name: Not on file    Number of children: Not on file    Years of education: Not on file    Highest education level: Not on file   Social Needs    Financial resource strain: Not on file    Food insecurity - worry: Not on file    Food insecurity - inability: Not on file    Transportation needs - medical: Not on file    Transportation needs - non-medical: Not on file   Occupational History    Not on file   Tobacco Use    Smoking status: Never Smoker    Smokeless tobacco: Never Used   Substance and Sexual Activity    Alcohol use: Yes       Frequency: 2-3 times a week     Drinks per session: 7 to 9     Binge frequency: Weekly    Drug use: No     Comment: former cocaine user 2x a week for 1-2 years     Sexual activity: Yes     Partners: Female     Birth control/protection: Condom, IUD     Comment: not monogmous    Other Topics Concern    Not on file   Social History Narrative    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 Moved to [place] from [place] August 2018.        PMH, PSH, FH, and SH reviewed and updated as above    Current Outpatient Medications   Medication Sig Dispense Refill    buPROPion (WELLBUTRIN XL) 150 MG XL tablet Take 1 tablet (150 mg) by mouth every morning. 30 tablet 1    LamoTRIgine ER 100 MG TB24 Take 1 tablet (100 mg) by mouth at bedtime. 30 tablet 1    traZODone (DESYREL) 50 MG tablet Take 1 tablet (50 mg) by mouth nightly. 30 tablet 1    vortioxetine (TRINTELLIX) 20 MG tablet Take 1 tablet (20 mg) by mouth daily. 30 tablet 2     No current facility-administered medications for this visit.        OBJECTIVE   Vitals   BP 118/74 (BP Location: Right arm, BP Patient Position: Sitting, BP cuff size: Regular)    Pulse 68    Temp 98.2 F (36.8 C) (Temporal)    Resp 16    Ht 5\' 8"  (1.727 m)    Wt 63.5 kg (140 lb)    LMP 02/09/2018 (Exact Date)    SpO2 99%    Breastfeeding? No    BMI 21.29 kg/m     Physical Exam:  General: NAD, AOx3, well-developed, well-nourished young female   HEENT: sclera anicteric, MMM, pharynx w/out erythema or exudate  Neck:no LAD, no thyromegaly  CV: RRR, S1/S2 normal, no r/m/g  Lungs: CTAB, no rales/rhonchi/wheezing, no increased wob, no accessory muscle use  Abdomen: Soft, NTND  Extremities: no edema  Neuro: no focal deficits  Psych: Normal mood and affect, no SI/HI  Skin: Warts on fingers (see uploaded image)           ASSESSMENT/PLAN  Stacy Barker  Stacy Barker is a 22 year old female with major depressive disorder and past history of risky behaviour presenting to establish care.     #MDD  Takes  wellbutrin, lamotrigine and Trintellix. Trazodone for sleep. Patient had a PHQ score 13 today, primarily due to impaired concentration in lectures and difficulty sleeping. Denies SI.  Seeing psychiatrist in 1 month. Reviewed PHQ score PHQ9 Patient Summary Score (calculated): 13 and discussed options with patient and employed shared-decision making in developing follow-up plan.       - Continue current medication   - Follow up with psychiatry   - Advised to call for sooner appointment or hotline if feeling suicidal or very overwhelmed.     #H/o cocaine abuse   Endorses history of cocaine use. 1-2x per week for 2 years prior to moving to [facility] in August. Reports that she has stopped.   - HIV and Hepatitis C testing     #Premiscuity   Sexually active with multiple partners. Uses condoms. IUD placed by gyn last month and tested negative for GC. Not tested for other STDs and is interested. Also would like HPV vaccine.   - Syphilis screen, HIV, Hepatitis B screen  - HPV series ordered     #Binge drinking disorder   Counseled extensively. Acknowledges it has been a problem and making effort to stop.   - CMP    #Family history of diabetes type 1 and type 2   She requests screening for diabetes due to family history. Denies symptoms of fatigue, weight loss, urinary frequency.   - CMP and HbA1C    #Warts of fingers   - Dermatology referral     #Routine Health Maintenance  - Due for pap smear. Advised to make an appointment with Gynecology at her next IUD check appointment.   - Up to date on vaccines     RTC in 6 months for routine follow up     Patient discussed with attending physician, Dr. Conley Rolls who agrees with above plan.     Deatra James, MD  [facility], PGY-1

## 2018-02-12 NOTE — Telephone Encounter (Signed)
Called patient regarding recurrent yeast and fluconazole suppression treatment. On 1/7 the vaginosis swab was negative. Given history of recurrent yeast infections, I prescribed Fluconazole. On 1/31 I repeated the vaginosis swab and it came back positive for yeast. I assumed patient was taking Fluconazole already but wanted to see how she was doing now.      She reports she never started the fluconazole prescription. Instead in early February she took an over the counter medication for vaginal discharge and it resolved. She is interested in now starting a suppression regimen. I told her the prescription should still be at her pharmacy but do call to make sure. I am happy to re-prescribe it if necessary.    Stacy DuverneySheila Sanjuanita Condrey MD MPH

## 2018-02-12 NOTE — Progress Notes (Signed)
ATTENDING NOTE:    SUBJECTIVE:   I reviewed the history and interviewed the patient.  History of present illness (HPI):  Stacy Barker is a 22 year old female who presents for Establish Care and Referral/authorization (Optometry )    Here to establish care.   Pt with hx of MDD, followed by psych but needs to transfer to outside psych.  On wellbutrin/lamictal and trintellix.  Takes trazodone for sleep but still w some insomnia,  NO SI.   Has family hx of DM.  No prior paps and sexually active.     Review of Systems (ROS): As per  the resident's note.  Past Medical, Family, Social History:  Reviewed and updated as per resident note.    Current Outpatient Medications   Medication Sig    buPROPion (WELLBUTRIN XL) 150 MG XL tablet Take 1 tablet (150 mg) by mouth every morning.    LamoTRIgine ER 100 MG TB24 Take 1 tablet (100 mg) by mouth at bedtime.    traZODone (DESYREL) 50 MG tablet Take 1 tablet (50 mg) by mouth nightly.    vortioxetine (TRINTELLIX) 20 MG tablet Take 1 tablet (20 mg) by mouth daily.     No current facility-administered medications for this visit.        OBJECTIVE: BP 118/74 (BP Location: Right arm, BP Patient Position: Sitting, BP cuff size: Regular)    Pulse 68    Temp 98.2 F (36.8 C) (Temporal)    Resp 16    Ht 5\' 8"  (1.727 m)    Wt 63.5 kg (140 lb)    LMP 02/09/2018 (Exact Date)    SpO2 99%    Breastfeeding? No    BMI 21.29 kg/m     I have examined the patient and I concur with the resident's exam.    MEDICAL PLAN of CARE:     Assessment and plan reviewed with the resident physician.  I agree with the resident's plan as documented.  See the resident's note for further details.      ICD-10-CM ICD-9-CM    1. Routine adult health maintenance Z00.00 V70.0 Comprehensive Metabolic Panel Green      Glycosylated Hgb(A1C), Blood Lavender   2. Routine eye exam Z01.00 V72.0 Optometry Clinic   3. Screening for STD (sexually transmitted disease) Z11.3 V74.5 Syphilis Screen, Blood Yellow serum separator  tube      HIV 1/2 Antibody & P24 Antigen Assay Yellow serum separator tube      Hepatitis C Antibody Yellow serum separator tube      Hepatitis B Surface Antibody, Quant Yellow serum separator tube      Hepatitis B Surface Ag, Blood Yellow serum separator tube      Hepatitis B Core Antibody Total Yellow serum separator tube   4. Viral warts, unspecified type B07.9 078.10 Dermatology Clinic   5. Encounter for screening for depression Z13.31 V79.0 Counseled for depression   6. Depressive disorder F32.9 311 Counseled for depression

## 2018-02-12 NOTE — Interdisciplinary (Signed)
Blood drawn from right arm with 21 gauge needle. 5 tubes taken.   Patient identity authenticated by Helen Grace Rasch.

## 2018-02-12 NOTE — Patient Instructions (Addendum)
Labs and make nursing appointment for HPV vaccine administration.   Recommend follow up with your psychiatrist within 1 month.

## 2018-02-13 LAB — SYPHILIS EIA SCREEN, BLOOD: Syphilis EIA Screen: NEGATIVE

## 2018-02-13 LAB — HIV 1/2 ANTIBODY & P24 ANTIGEN ASSAY, BLOOD: HIV 1/2 Antibody & P24 Antigen Assay: NONREACTIVE

## 2018-02-21 ENCOUNTER — Ambulatory Visit (INDEPENDENT_AMBULATORY_CARE_PROVIDER_SITE_OTHER): Payer: Managed Care, Other (non HMO)

## 2018-02-25 ENCOUNTER — Encounter (INDEPENDENT_AMBULATORY_CARE_PROVIDER_SITE_OTHER): Payer: Vision Other Private Insurance | Admitting: Optometrist

## 2018-04-30 ENCOUNTER — Telehealth (INDEPENDENT_AMBULATORY_CARE_PROVIDER_SITE_OTHER): Payer: Self-pay | Admitting: Clinical

## 2018-04-30 NOTE — Telephone Encounter (Signed)
04/30/18  12:53     Left VM for student indicating I was "checking in" on her and left my direct phone line if she felt the need to talk.  Wished her well and advised there was no need to call back if she was doing well.    Total time 2 min.    Bubba Camp, Ph.D.

## 2018-09-16 ENCOUNTER — Telehealth (INDEPENDENT_AMBULATORY_CARE_PROVIDER_SITE_OTHER): Payer: Self-pay | Admitting: Obstetrics & Gynecology

## 2018-09-16 NOTE — Telephone Encounter (Signed)
Pt called and is experiencing vaginal yellowish discharge with odor and it started approximately 3 weeks ago. Please advise, thank you.

## 2018-09-16 NOTE — Telephone Encounter (Signed)
Pt c/o vaginal discharge X 3 weeks. Request evaluation.  Appt scheduled 09/18/18.

## 2018-09-18 ENCOUNTER — Encounter (INDEPENDENT_AMBULATORY_CARE_PROVIDER_SITE_OTHER): Payer: Self-pay | Admitting: Women's Health

## 2018-09-18 ENCOUNTER — Other Ambulatory Visit: Payer: Managed Care, Other (non HMO) | Attending: Women's Health | Admitting: Women's Health

## 2018-09-18 VITALS — BP 109/63 | HR 71 | Temp 98.9°F | Ht 68.0 in | Wt 147.0 lb

## 2018-09-18 DIAGNOSIS — N760 Acute vaginitis: Secondary | ICD-10-CM

## 2018-09-18 DIAGNOSIS — Z975 Presence of (intrauterine) contraceptive device: Secondary | ICD-10-CM

## 2018-09-18 DIAGNOSIS — Z113 Encounter for screening for infections with a predominantly sexual mode of transmission: Secondary | ICD-10-CM | POA: Insufficient documentation

## 2018-09-18 DIAGNOSIS — N898 Other specified noninflammatory disorders of vagina: Principal | ICD-10-CM | POA: Insufficient documentation

## 2018-09-18 DIAGNOSIS — B9689 Other specified bacterial agents as the cause of diseases classified elsewhere: Secondary | ICD-10-CM

## 2018-09-18 MED ORDER — METRONIDAZOLE 0.75 % VA GEL
1.0000 | Freq: Every evening | VAGINAL | 0 refills | Status: DC
Start: 2018-09-18 — End: 2018-10-09

## 2018-09-18 NOTE — Patient Instructions (Signed)

## 2018-09-18 NOTE — Progress Notes (Addendum)
Problem Visit    Stacy Barker is a 22 year old G0P0000 female who presents c/o vaginal odor. Started about a week ago. Can't stand her own smell. Smells like fish.   Sexually active  With one partner x one year. Has paragard IUD placed 12/2017, able to feel strings.     Also advised by her PMD that she needs WWE/PAP, has never had one.     Review of Systems  Constitutional: Negative  Eyes: Glasses/Contacts  ENT: Negative  Cardiac: Chest pain  Pulmonary: Negative  Gastrointestional: Negative  Musculoskeletal: Negative  Skin: Negative  Neurologic: Negative  Psychiatric: Negative  Endocrine: Negative  Blood Disease: Negative  Allergy: Negative  OB/Gyn: Vaginal discharge      Past medical, surgical, OB/GYN, family and social history reviewed today and updated in the EMR.    Past Medical History:   Diagnosis Date   . Anxiety 2013   . Major depressive disorder, single episode 2013       Past Surgical History:   Procedure Laterality Date   . NO PAST SURGERIES         Family History   Problem Relation Name Age of Onset   . No Known Problems Mother     . No Known Problems Father     . Depression Paternal Aunt     . Bipolar Disorder MCousin     . No Known Problems Brother     . Lung Cancer Maternal Grandfather  29        smoker    . Diabetes Paternal Grandmother          type 1    . Heart Disease Paternal Grandmother  14   . Diabetes Paternal Uncle          type 1   . Diabetes Maternal Aunt          type 2   . Breast Cancer Neg Hx     . Ovarian Cancer Neg Hx     . Colorectal Cancer Neg Hx         Social History:  Social History     Tobacco Use   Smoking Status Never Smoker   Smokeless Tobacco Never Used       Social History     Substance and Sexual Activity   Alcohol Use Yes   . Frequency: 2-3 times a week   . Drinks per session: 7 to 9   . Binge frequency: Weekly           Allergies:  Patient has no known allergies.    Current medications:  See EMR      PHYSICAL EXAMINATION:  BP 109/63   Pulse 71   Temp 98.9 F (37.2  C)   Ht 5\' 8"  (1.727 m)   Wt 66.7 kg (147 lb)   LMP 08/24/2018   BMI 22.35 kg/m    General Appearance: in no apparent distress, well developed and well nourished, non-toxic, in no respiratory distress and acyanotic, alert, oriented times 3, well groomed and dressed and afebrile      Pelvic Exam:  Vulva: Normal external genitalia and Bartholin's glands, urethra, Skene's glands negative  Urethra: normal  Bladder: normal and normal urethral meatus  Vagina: no lesions, copious frothy yellow discharge + odor    Cervix: Nulliparous, closed, mobile,  no discharge, IUD strings visualized. NO CMT    Uterus: normal size, shape, contour  Adnexa: normal without masses and no tenderness  Anus/Perineum: normal  appearing, no lesions  Rectal: deferred    Wet mount: + Whiff, + Clue cells. Neg hyphae or buds     Assessment and plan:  1. Vaginal odor  - Vaginosis Screen BD Affirm Collection System  - Chlamydia/GC PCR, Genital Cobas PCR collection swab    2. Routine screening for STI (sexually transmitted infection)  - Vaginosis Screen BD Affirm Collection System  - Chlamydia/GC PCR, Genital Cobas PCR collection swab    3. BV (bacterial vaginosis)  Reviewed diagnosis and pathophysiology of BV (shift in vaginal flora, elevation of vaginal pH). Reviewed high recurrence rate. Discussed treatment and options prophylaxis. Prophylaxis is usually initiated after BV diagnosed x3.   - metroNIDAZOLE (METROGEL) 0.75 % vaginal gel; Insert 1 Applicatorful vaginally nightly. X five nights  Dispense: 1 Tube; Refill: 0    Schedule for annual exam/recheck in 3 weeks        CLConnor WHNP-BC

## 2018-09-19 LAB — CHLAMYDIA/GONORRHEA PCR, GENITAL
Chlamydia trachomatis PCR: NOT DETECTED
Neisseria gonorrhoeae PCR: NOT DETECTED

## 2018-09-19 LAB — VAGINOSIS SCREEN
Candida, Vaginal Screen: NEGATIVE
Gardnerella, Vaginal Screen: POSITIVE — AB
Trichomonas, Vaginal Screen: NEGATIVE

## 2018-09-23 ENCOUNTER — Encounter (INDEPENDENT_AMBULATORY_CARE_PROVIDER_SITE_OTHER): Payer: Self-pay | Admitting: Women's Health

## 2018-10-08 ENCOUNTER — Other Ambulatory Visit: Payer: Managed Care, Other (non HMO) | Attending: Women's Health | Admitting: Women's Health

## 2018-10-08 ENCOUNTER — Encounter (INDEPENDENT_AMBULATORY_CARE_PROVIDER_SITE_OTHER): Payer: Self-pay | Admitting: Women's Health

## 2018-10-08 VITALS — BP 118/73 | HR 84 | Temp 97.8°F | Ht 68.0 in | Wt 150.0 lb

## 2018-10-08 DIAGNOSIS — N898 Other specified noninflammatory disorders of vagina: Secondary | ICD-10-CM | POA: Insufficient documentation

## 2018-10-08 DIAGNOSIS — Z23 Encounter for immunization: Secondary | ICD-10-CM

## 2018-10-08 LAB — VAGINOSIS SCREEN
Candida, Vaginal Screen: POSITIVE — AB
Gardnerella, Vaginal Screen: POSITIVE — AB
Trichomonas, Vaginal Screen: NEGATIVE

## 2018-10-08 NOTE — Patient Instructions (Signed)
Vulvar Care and Suggestions to Aid in Vulvar Pain and Itching   Careful care of the vulva can help alleviate some symptoms of itching, burning and irritation since the vulvar skin can be quite sensitive. The inner vulva is moist and has very thin skin, so may be more susceptible to injury. There are various strategies that can be used to support the vulva. Below is a list of techniques to help you with itching and pain.   1. Avoid contact with irritating chemicals: this includes any product that has soaps, perfumes and detergents like fabric softener, deodorant soap, bubble bath, feminine hygiene spray, scented or deodorized menstrual products and scented bath products. We do not endorse the use of Vagisil benzocaine, or over-the-counter products used for "feminine hygiene and itching." These contain potential irritants.   2. Wear 100% cotton underwear: enclosing the vulva in synthetic products holds both heat and moisture, conditions that worsen many vulvar conditions. Avoid use of panty hose, thongs, tights or other close fitting clothes for similar reasons. If you need to wear panty hose, cut the crotch out of the nylons to reduce the clinginess to the body.   3. Do not over-clean your vulva and rinse the skin off with plain water: use tap water only. The vulva is not dirty. You can also use sitz (shallow) baths, squirt bottles or bidets to aid in your hygiene. Pat the skin gently dry or dry with a cool hair dryer, but do not over dry since this can deprive the vulva of necessary moisture and oils. Rinse the vulva last after shampooing and washing as this will eliminate any residual soap or shampoos on the vulva. Overzealous washing (more than once a day) can lead to dryness, skin irritation, and susceptibility to inflammation. Do you not use scrub brushes, wash clothes or buffers on the vulva. You should wash gently with your hands only.   4. Use mild soap: If you prefer to use soap on the vulva, use a mild one  such as Neutrogena, Basis, Pears (made in [place]), Cetaphil (non-fragranced) or castile soap with olive oil. These are found at most pharmacies or health food stores. Remember that frequent use of soap will remove natural skin oils and may cause itching. It may be best to avoid soaps altogether and just rinse with plain water.   5. Try a compress of Aveeno: Aveeno is an anti-irritant that comes in many forms. You can try a compress made with the powdered oatmeal bath treatment. Place 2 tablespoons of Aveeno in 1-2 pints of water. Mix in a jar and refrigerate. You can apply this to the vulva 3-4 times a day as needed. Some find it helpful after intercourse when symptoms can typically flare.   6. Try cool wet tea bags: There is some therapeutic effect from tannins in tea. Wet plain black tea bags applied to inflamed tissue can be soothing. The wet tea bags should be applied directly to the skin and may be held in place with a menstrual pad.   7. Always use lubricants with sexual activity: Couples using barrier contraception (condoms or diaphragm) should use water-based or silicone-based lubricants. Water-based lubricants include as Astroglide, FemGlide, Wet, or Slippery Stuff. Two silicone-based lubricants include PJUR, Eros, and Pure Romance. Avoid the use of K-Y Jelly, which can become tacky and sticky. If you are not using latex barriers (condoms or diaphragm) as contraception, you may try oil-based lubricants such as mineral oil, vitamin D oil or olive oil. Silicone lubricants   have good qualities for menopausal women. [employer] on line carries some brands. Avoid oils that have flavors, scents, and sensation enhancers which can be more irritating to the vulvar skin.   8. Other suggestions: try to avoid sitting in a wet bathing suit or damp gym clothes. Do not douche. Avoid contraceptive devices that require spermicides that can irritate the skin.   9. Leave the vulva uncovered: allowing the vulva to  be free of clothing and underwear. This can increase comfort. Consider uncovering in the evening and during the night.   10. Cool compresses: You can reduce pain and itching by using cool compresses on the vulva. They also can reduce swelling and irritation after a minor vulvar procedure. Prepare these by putting a wet face cloth in plastic and then freezing or refrigerating it. You can also use a package of frozen peas wrapped in a clean soft cloth. Be careful that the compresses aren't so cold that you become numb, since this can put your skin at risk. You can also fill a plastic dishwashing detergent bottle with water, freeze it, and put it by your bed at night. If you awaken with itching or pain, put this bottle (wrapped in a thin soft cloth) between your legs.   11. Avoid hair removal: Although it is common for many women to remove their pubic hair by shaving, waxing, lasering, and using NAIR type products, this can aggravate the skin. Avoid these activities. Pubic hair growth after removal can feel "itchy", but usually this is for a short period of time. Try to use some of the strategies noted above to decrease your irritation and discomfort.   12. Artificial skin barrier: For vulvar comfort it is often helpful to use a bland emollient to protect the skin from friction, sweat, or discharge. Emollients are products like Vaseline, plain Crisco and Aquaphor or zinc oxide. They can help restore some of that barrier function as well as provide a soothing, frictionless sensation to the skin. These products can be used between medication applications (if your provider has prescribed them) or alone on a daily basis or before exercise.

## 2018-10-08 NOTE — Interdisciplinary (Signed)
Patient was given Gardasil Im in which she tolerated well. VIS form was given to the patient.  Lot #[id number]    Exp02/16/2022  [id number]    Medication verified by Irish Elders NP     Nichola Sizer

## 2018-10-09 ENCOUNTER — Encounter (INDEPENDENT_AMBULATORY_CARE_PROVIDER_SITE_OTHER): Payer: Self-pay | Admitting: Women's Health

## 2018-10-09 DIAGNOSIS — N760 Acute vaginitis: Principal | ICD-10-CM

## 2018-10-09 MED ORDER — FLUCONAZOLE 150 MG OR TABS
150.0000 mg | ORAL_TABLET | Freq: Once | ORAL | 0 refills | Status: AC
Start: 2018-10-09 — End: 2018-10-09

## 2018-10-09 MED ORDER — METRONIDAZOLE 0.75 % VA GEL
1.0000 | Freq: Every evening | VAGINAL | 0 refills | Status: DC
Start: 2018-10-09 — End: 2018-10-15

## 2018-10-09 NOTE — Progress Notes (Signed)
Problem Visit    Stacy Barker is a 22 year old G0P0000 female who presents for follow up. Seen 09/18/2018 for vaginal odor. Dx with BV. Rx for metronidazole. Completed and symptoms resolved. Then started with [place] d/c and itching. Thinks she has yeast. Used an over the counter monistat 2 days ago.     Also wants gardisil series initiation today       Review of Systems  Constitutional: Negative  Eyes: Negative  ENT: Negative  Cardiac: Negative  Pulmonary: Negative  Gastrointestional: Negative  Musculoskeletal: Negative  Skin: Negative  Neurologic: Negative  Psychiatric: Depressed  Endocrine: Negative  Allergy: Sinus problems  OB/Gyn: Vaginal discharge      Past medical, surgical, OB/GYN, family and social history reviewed today and updated in the EMR.    Past Medical History:   Diagnosis Date   . Anxiety 2013   . Eating disorder    . IUD (intrauterine device) in place    . Major depressive disorder, single episode 2013       Past Surgical History:   Procedure Laterality Date   . NO PAST SURGERIES         Family History   Problem Relation Name Age of Onset   . No Known Problems Mother     . No Known Problems Father     . Depression Paternal Aunt     . Bipolar Disorder MCousin     . No Known Problems Brother     . Lung Cancer Maternal Grandfather  58        smoker    . Diabetes Paternal Grandmother          type 1    . Heart Disease Paternal Grandmother  107   . Diabetes Paternal Uncle          type 1   . Diabetes Maternal Aunt          type 2   . Breast Cancer Neg Hx     . Ovarian Cancer Neg Hx     . Colorectal Cancer Neg Hx         Social History:  Social History     Tobacco Use   Smoking Status Never Smoker   Smokeless Tobacco Never Used       Social History     Substance and Sexual Activity   Alcohol Use Yes   . Frequency: 2-3 times a week   . Drinks per session: 7 to 9   . Binge frequency: Weekly           Allergies:  Patient has no known allergies.    Current medications:  See EMR      PHYSICAL  EXAMINATION:  BP 118/73 (BP Location: Left arm, BP Patient Position: Sitting, BP cuff size: Regular)   Pulse 84   Temp 97.8 F (36.6 C) (Oral)   Ht 5\' 8"  (1.727 m)   Wt 68 kg (150 lb)   LMP 09/24/2018   BMI 22.81 kg/m    General Appearance: in no apparent distress and well developed and well nourished      Pelvic Exam:  Vulva: Normal external genitalia  Urethra: normal  Bladder: normal and normal urethral meatus  Vagina: Normal mucosa, curdlike discharge   Cervix: Nulliparous, closed, mobile,  no discharge. IUD strings Trimmed  Uterus: normal size, shape, contour  Adnexa: normal without masses, NTTP  Anus/Perineum: normal appearing, no lesions  Rectal: deferred  Assessment and plan:    1. Vaginal itching  Discussed vulvar and vaginal health   Discussed recurrence.   IUD in situ   All questions were answered    - Vaginosis Screen BD Affirm Collection System  - Yeast Culture Sterile Container Vagina    2. Need for vaccination  Discussed RISKS/BENEFITS/SEVEREEVENTS to HPV Vaccination and patient consents '    - Gardasil-9 HPV Vaccine 3-Dose  (Admin now)  - Gardasil-9 HPV Vaccine 3-Dose  (Admin in 2 mon); Future  - Gardasil-9 HPV Vaccine 3-Dose (Admin in 6 mon); Future             CLConnor WHNP-BC

## 2018-10-11 LAB — YEAST CULTURE

## 2018-10-14 ENCOUNTER — Encounter (INDEPENDENT_AMBULATORY_CARE_PROVIDER_SITE_OTHER): Payer: Self-pay | Admitting: Women's Health

## 2018-10-14 DIAGNOSIS — B9689 Other specified bacterial agents as the cause of diseases classified elsewhere: Secondary | ICD-10-CM

## 2018-10-15 MED ORDER — METRONIDAZOLE 500 MG OR TABS
500.0000 mg | ORAL_TABLET | Freq: Two times a day (BID) | ORAL | 0 refills | Status: DC
Start: 2018-10-15 — End: 2019-06-10

## 2018-10-15 NOTE — Telephone Encounter (Signed)
Called patient. Discussed Prior Rx for metrogel and diflucan. Patient never started these.   Discussed oral vs vaginal. Patient desires oral. Rx for Metronidazole sent to pharmacy of record. Will take this with Diflucan and disregard the Rx for the Metrogel.   All questions were answered    CLConnor WHNP-BC

## 2018-11-03 ENCOUNTER — Encounter (INDEPENDENT_AMBULATORY_CARE_PROVIDER_SITE_OTHER): Payer: Self-pay | Admitting: Women's Health

## 2018-11-04 ENCOUNTER — Encounter (INDEPENDENT_AMBULATORY_CARE_PROVIDER_SITE_OTHER): Payer: Self-pay | Admitting: Women's Health

## 2018-11-04 NOTE — Telephone Encounter (Signed)
From: Alonza BogusLauren Agnes Barker  To: Garald Braveraroline Lee Aalaysia Liggins, NP  Sent: 11/03/2018 2:59 PM PST  Subject: UE:AVWUJRE:yeast vaginitis    Hi [name],    I used the prescriptions you prescribed me for the past week or so. I feel that I might still have a yeast infection; not sure if the BV is still there.    Is there any way I can schedule an appt with you for sometime soon? Should I call the office?    Thanks,    - Stacy Barker  ----- Message -----  From: Garald Braveraroline Lee Layann Bluett, NP  Sent: 10/15/2018 4:45 PM PST  To: [name]  Subject: WJ:XBJYNRE:yeast vaginitis  HI Stacy Barker     Did you use the metrogel that I prescribed last week?     [name]          ----- Message -----   From: [name]   Sent: 10/14/2018 8:29 PM PST   To: Garald Braveraroline Lee Jamahl Lemmons, NP  Subject: WG:NFAOZRE:yeast vaginitis    Hi [name],    I'm still feeling a lot of discomfort. If the test results stated that I still have a bacterial infection and a yeast infection, then I'm willing to pick up some prescriptions.     For the bacterial treatment on my last visit, there was the option of using a cream or an oral medication. Can I get the oral medication this time?      Thank you!  ----- Message -----  From: Garald Braveraroline Lee Massimo Hartland, NP  Sent: 10/14/2018 5:01 PM PST  To: [name]  Subject: yeast vaginitis  HI Stacy Barker     How are you feeling?     The yeast culture grew a heavy growth of candida.    Please let me know.   [facility]    Results   Yeast Culture Sterile Container Vagina (Order [id number])   Lab Specimen Information   Date and Time: Collected: 10/08/2018 17:59 Status: Final result -- AbnormalAbnormal   10/11/2018 12:56 PM - Electronic Interface To Epic, Softlab Lab Results     Specimen Information: Vagina; Genital    Yeast Culture Abnormal  Candida albicans   Heavy     Identification performed by Mass Spectrometry( Maldi-ToF). This test   was developed and its performance characteristics determined by [facility]. It has not been cleared  or   approved by the U.S. Food and Drug Administration. The FDA has   determined that such clearance or approval is not necessary.  CALM

## 2018-12-15 ENCOUNTER — Ambulatory Visit (INDEPENDENT_AMBULATORY_CARE_PROVIDER_SITE_OTHER): Payer: Managed Care, Other (non HMO)

## 2018-12-15 DIAGNOSIS — Z23 Encounter for immunization: Secondary | ICD-10-CM

## 2018-12-15 NOTE — Interdisciplinary (Signed)
Pt in clinic for 2nd HPV vaccine. Reviewed allergies w/ pt. Pt given HPV vaccine VIS. 0.56mL Gardasil-9 given today at 0410 to L deltoid. Pt tolerated well, no s/s of adverse effect. Pt reminded to schedule next vaccine due 6 mo from first shot. Pt VU.

## 2018-12-22 ENCOUNTER — Telehealth (INDEPENDENT_AMBULATORY_CARE_PROVIDER_SITE_OTHER): Payer: Self-pay

## 2018-12-22 NOTE — Telephone Encounter (Signed)
Called patient, left voicemail to call back and reschedule bmp'd appointment on March 16, 2019, nurse visit. Please assist with rescheduling.  Thank you.

## 2019-01-02 ENCOUNTER — Ambulatory Visit: Payer: Self-pay | Admitting: Mental Health

## 2019-01-02 ENCOUNTER — Encounter: Payer: Self-pay | Admitting: Hospital

## 2019-01-02 DIAGNOSIS — F332 Major depressive disorder, recurrent severe without psychotic features: Principal | ICD-10-CM
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
## 2019-01-07 ENCOUNTER — Ambulatory Visit: Payer: Self-pay | Admitting: Mental Health

## 2019-01-07 DIAGNOSIS — F332 Major depressive disorder, recurrent severe without psychotic features: Principal | ICD-10-CM

## 2019-01-08 NOTE — Progress Notes (Signed)
CAPS - INDIVIDUAL SESSION     Alonza BogusLauren Agnes Ventrella  GMW#[id number]  DOB:1996/04/29  Age:23 year old  YQI:[id number]:female  01/07/2019 with Shao-Fen Marzetta Lanza    Clinical Surveys scoring:     PHQ:   FM PHQ9 score 01/07/2019 01/02/2019 02/12/2018 02/12/2018 09/18/2017   PHQ9 Patient Summary Score (calculated) 5 10 13 5 6      Thoughts that you would be better off dead, or of hurting yourself in some way: (!) (P) Several days  Scoring:  0-4 minimal or none   5-9 mild   10-14 moderate    15-19 moderately severe   20-27 severe     GAD7:   Last GAD7 score with date 01/07/2019 01/02/2019 09/18/2017   GAD7 Patient Total 7 4 2      Scoring  5-9 mild   10-14 moderate   15+ severe     PERMA:   PERMA Totals  01/07/2019 01/02/2019   Positive Emotion 5.7 5   Engagement 6 3.3   Relationships 6.7 3.3   Meaning 6.3 5   Accomplishment 7.3 5.7   Overall Well-being 6.3 4.4     Scoring key: If your score is  9 or 10 = you are very high in this dimension 7 or 8 = high 6 = slightly above average 5 = average   4 = slightly below average 2 or 3 = low 0 or 1 = very low.      Reviewed yes    ASSESSMENT  PHQ score decreased and GAD score increased.    Progress Notes    Client reported that she is feeling much better after the urgent care session on Friday. Client reported that she was able to feel motivated and finished a fellowship application which she has procrastinated for a long time. She also hanged out with her friends on the weekend. Client reported that she still has "intrusive thoughts" about killing herself (which lasted for a few seconds) but she firmly denied any urge, intent, plan, or attempt to harm herself. Client noticed that when she feels lonely, she has more of these intrusive thoughts. Client elaborated on the convenient social environment [place] [employer], and stated that she does not have a group of friends here who are interested in going to a bar on the weekend with her. Reviewed the safety plan with the client, and helped  the client explore solitary activities that she truly enjoys without company. Also encouraged the client to have different types of friends so that she can engage in different activities with different groups of people.     Provided the list of off-campus providers and discussed about the options. Client reported that she will reach out to the providers on the list and make an appointment. No follow-up session was scheduled. Client will contact this provider again after she decided which therapist she will see so that this provider can generate a referral for her. Agreed that she will follow through the safety plan.       RISK ASSESSMENT:  [facility] CAPS RISK ASSESSMENT SUICIDE:   Suicidal ideation:     Current suicidal ideation: Yes      Suicidal ideation type: (In comments, include (a) nature of ideation, plan, intent, means and lethality.):  Ideation with no identified plan    If no, include reason(s) to support the determination of no risk factors:  Client reported that she still has "intrusive thoughts" about killing herself (which lasted for a few seconds) but  she firmly denied any urge, intent, plan, or attempt to harm herself.  Risk Factors:     Lifetime history of suicide attempts: Yes      Number of suicide attempts:  1    Lethality (Include means and intent.):  See previous note   Risk reducing factors:     Developed safety plan:  1) Provided National Suicide Prevention Hotline (979)096-2023) and encouraged the client to call while experiencing active SI  2) Provided information about CAPS after hour phone service  3) Encaougred the client to communicate with her parents about her depression and have them check in with her more frequently and provide emotional support  4) Change the emotionally reactive vague message "It's better to die" to a reality-based, concrete, factual description "I do not really want to die. I am feeling panic/deparied/hopeless now because the medication is not working.  However, I  need to be patience and give myself some time to find the effective medication/treatment plan again. I am taking action to take care of myself. I do not want to die because I want to be with people I love and do things I love. There are many things to look forward to."    Arranged hospitalization: No         Risk Factors    Risk of Violence Toward Others:   Current homicidal concerns: No    If no, include reason(s) to support the determination no violence concerns.:  Client firmly denied any thoughts, intent, plan, or attempt to harm others.    History of violence committed: No    Arranged hospitalization: No         Allergies, Medications and Problem List  No Known Allergies    Current Outpatient Medications:   .  buPROPion (WELLBUTRIN XL) 150 MG XL tablet, Take 1 tablet (150 mg) by mouth every morning., Disp: 30 tablet, Rfl: 1  .  LamoTRIgine ER 100 MG TB24, Take 1 tablet (100 mg) by mouth at bedtime., Disp: 30 tablet, Rfl: 1  .  metroNIDAZOLE (FLAGYL) 500 MG tablet, Take 1 tablet (500 mg) by mouth 2 times daily., Disp: 14 tablet, Rfl: 0  .  traZODone (DESYREL) 50 MG tablet, Take 1 tablet (50 mg) by mouth nightly., Disp: 30 tablet, Rfl: 1  .  vortioxetine (TRINTELLIX) 20 MG tablet, Take 1 tablet (20 mg) by mouth daily., Disp: 30 tablet, Rfl: 2  Patient Active Problem List   Diagnosis   . Major depressive disorder, single episode   . IUD (intrauterine device) in place       Mental Status Exam:    mental status exam unremarkable/normal           DIAGNOSTIC IMPRESSIONS  Encounter Diagnoses   Name Primary?   . 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    TREATMENT PLAN/GOALS  Provided the list of off-campus providers and discussed about the options. Client reported that she will reach out to the providers on the list and make an appointment. No follow-up session was scheduled. Client will contact  this provider again after she decided which therapist she will see so that this provider can generate a referral for her. Agreed that she will follow through the safety plan.       Referrals/Orders   No orders of the defined types were placed in this encounter.  Disposition  Return for CAPS: Referral: Off-Campus (Non-SHIP).    Patient instructions   There are no Patient Instructions on file for this visit.

## 2019-02-20 ENCOUNTER — Ambulatory Visit (INDEPENDENT_AMBULATORY_CARE_PROVIDER_SITE_OTHER): Payer: Self-pay | Admitting: Student in an Organized Health Care Education/Training Program

## 2019-02-20 ENCOUNTER — Other Ambulatory Visit: Payer: Managed Care, Other (non HMO) | Attending: Family

## 2019-02-20 DIAGNOSIS — R05 Cough: Principal | ICD-10-CM | POA: Insufficient documentation

## 2019-02-20 DIAGNOSIS — Z1159 Encounter for screening for other viral diseases: Principal | ICD-10-CM

## 2019-02-20 DIAGNOSIS — R0602 Shortness of breath: Secondary | ICD-10-CM | POA: Insufficient documentation

## 2019-02-20 NOTE — Telephone Encounter (Signed)
PROVIDER ACTION REQUESTED: NO, FYI Only  RN ACTION: Drive Up COVID 19 testing.  Pt with productive cough x 3 weeks with SOB. Denies fever. Went to music festival last Saturday. Unknown exposure. Feels cough is getting worse. Pt instructed to go to [facility] for drive up testing. Pt to call back for worsening cough, fever, sob. ER precautions given. Pt verbalized understanding of treatment plan.   Reason for Call: Cough     Disposition: Home care       Has patient received 19/20 flu shot: yes  Appt scheduled: Yes. Drive up testing  (Updated 02/20/2019)    Use as part of cough, fever or shortness of breath triage:  1. Do you have a fever AND a new cough or shortness of breath (SOB) AND at high risk for complications due to COVID-19*  no  . High risk includes age>65, smoker, chronic lung disease, chronic heart disease, chronic liver disease, chronic kidney disease, immunosuppression, no  2. Do you have a fever AND new cough or shortness of breath (SOB) AND work in a healthcare environment? no  3. Do you have a fever AND new cough or SOB AND have one of the following epidemiological risks within the last 14 days: yes    Travelled via air, bus, or train for more than 2 hours?    Attended a national or international meeting/event/gathering at a large (>100) indoor venue?          Travel to a high risk area (i.e. [place] or [employer])?          Travel to a affected countries including [place] (not [place], [place] or [place]), [place], [place], [place], [place]?                     Close contact with another person (i.e. family members) who has any of the risks above?  4. Has the patient been in CLOSE contact (less than 6 feet) with a laboratory confirmed COVID-19 patient AND is now reporting fever OR cough or difficulty breathing? no    IF YES to any of the above, evaluate for disposition     Emergency Department  . Patient has difficulty breathing, sounds very sick or weak to triager, has underlying respiratory or  cardiac disease (e.g., asthma, COPD, heart failure)  . If YES, ask patient to be seen in nearest [facility] emergency department  . Ask the patient to request a mask as soon as they enter the building and remind them to wash their hands or use alcohol gel.  . Please alert the location by calling that the patient will be arriving and give them a time estimate for their arrival. Please place ED referral also as per usual.    Urgent Care:   . Patients who have high risk for co-morbidity will need evaluation by a provider.  High risk factors include: age over 18, active smoker, chronic lung / heart / liver / renal disease, immunosuppressed status, or active cancer.  If YES, refer patient to Urgent Care.  . Patient with reported high fever (100.3 F if measured).  If YES, refer patient Urgent Care.  . Ask the patient to call urgent care staff on arrival, and request a mask as soon as they enter the building and remind them to wash their hands or use alcohol gel.  . Please alert the location by calling that the patient will be arriving and give them a time estimate for their arrival. Please  place Clockwise referral also as per usual.    Schedule drive up screening (For Primary care triage nurse only, for specialty areas, refer patient to primary care provider)  . Indicated for patients with low risk of clinical deterioration and do not have any high risk factors for severe illness.  . Schedule [facility] appointment [facility]  . Pend COVID order panel (for Nasopharyngeal/Oropharyngeal assays) and route to provider for approval  . Instruct patient to page urgent care staff when they arrive      If no:  Triage as usual.      Reason for Disposition  . Cough has been present for > 3 weeks    Additional Information  . Negative: SEVERE coughing spells (e.g., whooping sound after coughing, vomiting after coughing)  . Negative: [1] Continuous (nonstop) coughing interferes with work or school AND [2] no improvement using cough treatment  per Care Advice  . Negative: Coughing up rusty-colored (reddish-brown) sputum  . Negative: Fever present > 3 days (72 hours)  . Negative: [1] Fever returns after gone for over 24 hours AND [2] symptoms worse or not improved  . Negative: [1] Using nasal washes and pain medicine > 24 hours AND [2] sinus pain (around cheekbone or eye) persists  . Negative: Earache  . Negative: [1] Known COPD or other severe lung disease (i.e., bronchiectasis, cystic fibrosis, lung surgery) AND [2] worsening symptoms (i.e., increased sputum purulence or amount, increased breathing difficulty  . Negative: [1] Coughed up blood AND [2] > 1 tablespoon (15 ml) (Exception: blood-tinged sputum)  . Negative: Fever > 103 F (39.4 C)  . Negative: [1] Fever > 101 F (38.3 C) AND [2] age > 43  . Negative: [1] Fever > 100.0 F (37.8 C) AND [2] bedridden (e.g., nursing home patient, CVA, chronic illness, recovering from surgery)  . Negative: [1] Fever > 100.0 F (37.8 C) AND [2] diabetes mellitus or weak immune system (e.g., HIV positive, cancer chemo, splenectomy, organ transplant, chronic steroids)  . Negative: Wheezing is present  . Negative: Patient sounds very sick or weak to the triager  . Negative: [1] Previous asthma attacks AND [2] this feels like asthma attack  . Negative: Dry (non-productive) cough (i.e., no sputum or minimal clear sputum)  . Negative: Chest pain  (Exception: MILD central chest pain, present only when coughing)  . Negative: Difficulty breathing  . Negative: Severe difficulty breathing (e.g., struggling for each breath, speaks in single words)  . Negative: Bluish (or gray) lips or face now  . Negative: [1] Difficulty breathing AND [2] exposure to flames, smoke, or fumes  . Negative: [1] Stridor AND [2] difficulty breathing  . Negative: Sounds like a life-threatening emergency to the triager    Answer Assessment - Initial Assessment Questions  1. ONSET: "When did the cough begin?"       2 weeks ago  2. SEVERITY: "How bad is the  cough today?"       worsening  3. RESPIRATORY DISTRESS: "Describe your breathing."       Not able to take deep breath without coughing  4. FEVER: "Do you have a fever?" If so, ask: "What is your temperature, how was it measured, and when did it start?"      no  5. SPUTUM: "Describe the color of your sputum" (clear, white, yellow, green)      Yellow-green  6. HEMOPTYSIS: "Are you coughing up any blood?" If so ask: "How much?" (flecks, streaks, tablespoons, etc.)  no  7. CARDIAC HISTORY: "Do you have any history of heart disease?" (e.g., heart attack, congestive heart failure)       no  8. LUNG HISTORY: "Do you have any history of lung disease?"  (e.g., pulmonary embolus, asthma, emphysema)      no  9. PE RISK FACTORS: "Do you have a history of blood clots?" (or: recent major surgery, recent prolonged travel, bedridden)      no  10. OTHER SYMPTOMS: "Do you have any other symptoms?" (e.g., runny nose, wheezing, chest pain)        Mild wheezing  11. PREGNANCY: "Is there any chance you are pregnant?" "When was your last menstrual period?"        1 month.   12. TRAVEL: "Have you traveled out of the country in the last month?" (e.g., travel history, exposures)        [place] 3 weeks ago    Protocols used: COUGH - ACUTE PRODUCTIVE-A-AH

## 2019-02-20 NOTE — Progress Notes (Signed)
Obtained oropharyngeal and nasal pharyngeal specimens from patient and submitted to lab. Patient given self care Isolation Instructions to follow while waiting for test results.

## 2019-02-20 NOTE — Telephone Encounter (Signed)
Symptom Call          Next office visit:  No apt scheduled     List the date of PCP first available:     Did you offer Express Care/Urgent Care:  No    What symptom is the patient experiencing?  Pt has cold symptoms and wants to get tested for the corona virus. Bad cough , shortness of breath.      Name of PCP Provider: No Pcp, Per Patient       Insurance Coverage Verified: Active- in network     Last office visit: 02/12/2018 to est care with Dr. Deatra James    Who is reporting the symptoms? Incoming call from patient    Is this a new or ongoing symptom? new  Estimated time since experiencing symptom(s)? 2 weeks, worse these past 2 days     Best way to contact patient:   Home  [phone]    Alternative communication method:   Home  [phone]

## 2019-02-21 LAB — INFLUENZA/RSV PCR PANEL
Influenza A, PCR: NOT DETECTED
Influenza B, PCR: NOT DETECTED
Respiratory Syncytial Virus PCR: NOT DETECTED

## 2019-02-25 ENCOUNTER — Telehealth (INDEPENDENT_AMBULATORY_CARE_PROVIDER_SITE_OTHER): Payer: Self-pay | Admitting: Family

## 2019-02-25 NOTE — Telephone Encounter (Signed)
Notified Patient of results so far (Negative flu and RSV) and that we are waiting for COVID-19 results in another 24-48 hours.      Re-evaluated patient for progression of symptoms:     . Is the patient short of breath, having difficulty breathing?  no  . Is the patient coughing?  yes - no change   . Has the cough changed?  no  . What have the measured temperature at home in the last 24 hours?  normal  . Does the patient have fatigue or sound sick or weak? No   . Is the patient answering questions appropriately without evidence of confusion?  yes -   . Does the patient have any vomiting or diarrhea?  no  . Can you tolerate liquids? yes -   . Does the patient need ED referral?  no  . Other comments:       Informed patient to continue home isolation until COVID-19 results are back. yes -     Informed patient will Follow up with a phone call in 24 hours until Covid-19 results come back. yes -     Provided Telephone Contact During Work Hours [phone] yes -       Reviewed indications for Emergency Department Referral:yes -   . Shortness of breath or difficulty getting air   . Chest pain  . Confusion or disorientation  . Dehydration or uncontrolled vomiting or diarrhea

## 2019-02-25 NOTE — Telephone Encounter (Signed)
Flu, RSV negative waiting for COVID-19 send out results.   Please notify patient of results so far and that we are waiting for COVID results in another 24-48 hours.  Please reevaluate patient for progression of symptoms.  Please ask patient to continue home isolation until COVID results are back.    Results for orders placed or performed in visit on 02/20/19   Influenza/RSV PCR Panel   Result Value Ref Range    Influenza A, PCR Not Detected See Comment    Influenza B, PCR Not Detected See Comment    Respiratory Syncytial Virus PCR Not Detected See Comment      Jahzaria Vary, NP-C

## 2019-02-26 NOTE — Telephone Encounter (Signed)
RE-CHECK     Re-evaluated patient for progression of symptoms:   Pt has no sxs , pt is feeling much better.     Gave patient cdc recommendations about home isolation and when she can come off.     Will check on her tomorrow.

## 2019-02-27 NOTE — Telephone Encounter (Signed)
Left message for patient to call back COVID-19 line [phone]

## 2019-02-28 NOTE — Telephone Encounter (Signed)
Left message for patient to call back COVID-19 line [phone]

## 2019-03-01 NOTE — Telephone Encounter (Signed)
Re-evaluated patient for progression of symptoms:     . Is the patient short of breath, having difficulty breathing?  no  . Is the patient coughing?  no  . Has the cough changed?  no  . What have the measured temperature at home in the last 24 hours?  normal  . Does the patient have fatigue or sound sick or weak? no  . Is the patient answering questions appropriately without evidence of confusion?  yes   . Does the patient have any vomiting or diarrhea?  no  . Can you tolerate liquids? yes   . Does the patient need ED referral?  no  Other comments:   Pt feeling better    Informed patient to continue home isolation until COVID-19 results are back. Yes   Informed patient will follow up with a phone call in 24 hours until Covid-19 results come back. Yes   Provided Telephone Contact during Work Hours [phone] Yes  Reviewed indications for Emergency Department Referral: Yes   . Shortness of breath or difficulty getting air   . Chest pain  . Confusion or disorientation  . Dehydration or uncontrolled vomiting or diarrhea

## 2019-03-01 NOTE — Telephone Encounter (Signed)
Left message for patient to call back COVID-19 line [phone]

## 2019-03-02 NOTE — Telephone Encounter (Signed)
Pt currently 3+ days no sxs will call every other day

## 2019-03-04 NOTE — Telephone Encounter (Signed)
Left message for patient to call back for COVID-19 Follow Up at  [phone]

## 2019-03-05 LAB — CORONAVIRUS COVID-19 DETECTION ASSAY (SEND OUT): COVID-19 Coronavirus by PCR – External Lab: NOT DETECTED

## 2019-03-05 NOTE — Telephone Encounter (Signed)
Left message for patient to call back for COVID-19 Follow Up at  [phone] have pt Press 2

## 2019-03-05 NOTE — Telephone Encounter (Signed)
DAY E-CHECK   Patient verified by last name and BD    Re-evaluated patient for progression of symptoms:     . Is the patient short of breath, having difficulty breathing?  no  . Is the patient coughing?  Mild cough  . Has the cough changed?  no  . What have the measured temperature at home in the last 24 hours?  No  . Does the patient have fatigue or sound sick or weak? no  . Is the patient answering questions appropriately without evidence of confusion?  yes  . Does the patient have any vomiting or diarrhea?  no  . Can you tolerate liquids? yes  . Does the patient need ED referral?  no  . Other comments:       Informed patient to continue home isolation until COVID-19 results are back. yes    Informed patient will Follow up with a phone call in 24 hours until Covid-19 results come back. yes    Provided Telephone Contact During Work Hours [phone].  yes      Reviewed indications for Emergency Department Referral: yes  . Shortness of breath or difficulty getting air   . Chest pain  . Confusion or disorientation  . Dehydration or uncontrolled vomiting or diarrhea.    Patient stated that he saw her result  on my chart

## 2019-03-06 NOTE — Telephone Encounter (Signed)
Left message for patient to call back for COVID-19 Follow Up at  [phone] have pt Press 2

## 2019-03-06 NOTE — Telephone Encounter (Signed)
Pt has been notified, no further action needed.  DONE  Stacy Barker    Your test for COVID-19 is NEGATIVE:    Because this is a newer test we still recommend that you leave home after these three things have happened:    1. You have had no fever for at least 72 hours (that is three full days of no fever without the use medicine that reduces fevers)  AND  2. other symptoms have improved (for example, when your cough or shortness of breath have improved)  AND  3. at least 7 days have passed since your symptoms first appeared    Contact our dedicated nurse line [phone], if you are noting worsening respiratory symptoms and need advice. This line is open 8 am -5 pm 7 days a week.  If sudden and severe worsening in symptoms please call 911.      Thank you,  Stacy Barker, LVN  Signature Derived From Controlled Access Password, March 06, 2019, 12:52 PM  03/05/2019 12:06 PM - Electronic Interface To Epic, [facility] Lab Results     Component Value Lab   COVID-19 Coronavirus Source - External Lab Not Given  [facility]   COVID-19 Coronavirus by PCR - External Lab Not Detected  [facility]

## 2019-03-16 ENCOUNTER — Encounter (INDEPENDENT_AMBULATORY_CARE_PROVIDER_SITE_OTHER): Payer: Managed Care, Other (non HMO)

## 2019-04-19 ENCOUNTER — Encounter (INDEPENDENT_AMBULATORY_CARE_PROVIDER_SITE_OTHER): Payer: Self-pay | Admitting: Family Medicine

## 2019-04-19 DIAGNOSIS — Z1159 Encounter for screening for other viral diseases: Secondary | ICD-10-CM

## 2019-04-29 LAB — COVID-19 CORONAVIRUS DETECTION ASSAY AT ~~LOC~~ LAB - RETURN TO LEARN: COVID-19 Coronavirus Result: NOT DETECTED

## 2019-06-10 ENCOUNTER — Ambulatory Visit (INDEPENDENT_AMBULATORY_CARE_PROVIDER_SITE_OTHER)

## 2019-06-10 ENCOUNTER — Telehealth: Admitting: Physician Assistant

## 2019-06-10 ENCOUNTER — Telehealth (INDEPENDENT_AMBULATORY_CARE_PROVIDER_SITE_OTHER): Payer: Self-pay

## 2019-06-13 LAB — SPECIMEN STATUS REPORT-LABCORP

## 2019-06-13 LAB — SARS-COV-2, NAA - LABCORP: COVID-19 Coronavirus PCR - LabCorp: NOT DETECTED

## 2019-09-09 ENCOUNTER — Encounter (INDEPENDENT_AMBULATORY_CARE_PROVIDER_SITE_OTHER): Payer: Self-pay | Admitting: Family Medicine

## 2019-09-09 DIAGNOSIS — Z1159 Encounter for screening for other viral diseases: Secondary | ICD-10-CM

## 2019-12-24 ENCOUNTER — Encounter: Payer: Self-pay | Admitting: Hospital

## 2020-07-11 ENCOUNTER — Encounter: Payer: Self-pay | Admitting: Adult Health

## 2020-07-11 ENCOUNTER — Ambulatory Visit (INDEPENDENT_AMBULATORY_CARE_PROVIDER_SITE_OTHER): Payer: Commercial Managed Care - PPO | Admitting: Adult Health

## 2020-07-11 ENCOUNTER — Other Ambulatory Visit: Payer: Self-pay

## 2020-07-11 VITALS — BP 126/79 | HR 79 | Ht 68.0 in | Wt 150.0 lb

## 2020-07-11 DIAGNOSIS — F331 Major depressive disorder, recurrent, moderate: Secondary | ICD-10-CM | POA: Diagnosis not present

## 2020-07-11 DIAGNOSIS — F411 Generalized anxiety disorder: Secondary | ICD-10-CM

## 2020-07-11 DIAGNOSIS — G4700 Insomnia, unspecified: Secondary | ICD-10-CM | POA: Diagnosis not present

## 2020-07-11 MED ORDER — LAMOTRIGINE 100 MG PO TABS: 100.0000 mg | ORAL_TABLET | Freq: Every day | ORAL | 1 refills | Status: DC

## 2020-07-11 MED ORDER — TRAZODONE HCL 50 MG PO TABS: 50.0000 mg | ORAL_TABLET | Freq: Every day | ORAL | 1 refills | Status: DC

## 2020-07-11 MED ORDER — TRINTELLIX 20 MG PO TABS: 20.0000 mg | ORAL_TABLET | Freq: Every day | ORAL | 1 refills | Status: DC

## 2020-07-11 NOTE — Progress Notes (Signed)
[facility] MD/PA/NP Initial Note  07/11/2020 3:46 PM Brenda Diaz  MRN:  [id number]  Chief Complaint:   HPI:   Describes mood today as "ok". Pleasant. Mood symptoms - denies depression, anxiety, and irritability. Stating "I feel like I'm doing pretty good". Has been on current medication regimen for the past 4 to 5 years and feel they continue to work well for her. Recently moved to [facility] from [place] with her job. Feels like she has adjusted well to the transition. Mood has remained stable. Stating "I'm in a good place". Stable interest and motivation. Taking medications as prescribed.  Energy levels stable. Active, has a regular exercise routine - 3 to 4 times a week.   Enjoys some usual interests and activities. Lives alone. No pets. Family live in [place]. Has met friends locally and feels like she has a good support system. Spending time with family. Appetite adequate. Weight stable. Sleeps well most nights. Averages 9 hours. Focus and concentration stable. Completing tasks. Managing aspects of household. Works full-time. Work going well - works at [employer].  Denies SI or HI. Denies AH or VH.  Previous medication trials: Zoloft  Visit Diagnosis:    ICD-10-CM   1. Major depressive disorder, recurrent episode, moderate (HCC)  F33.1 TRINTELLIX 20 MG TABS tablet    lamoTRIgine (LAMICTAL) 100 MG tablet  2. Generalized anxiety disorder  F41.1 TRINTELLIX 20 MG TABS tablet  3. Insomnia, unspecified type  G47.00 traZODone (DESYREL) 50 MG tablet    Past Psychiatric History: Admitted in 2016 and 2014, and 2013 for depression - eating disorder and anorexia. Self destructive behaviors - cutting. Tried to hang herself in 2018. Felt like circumstances related to school were a trigger. Depressed her junior and senior years of high school.   Past Medical History: History reviewed. No pertinent past medical history. History reviewed. No pertinent surgical history.  Family Psychiatric History: Cousin -  Bipolar disorder. Has two paternal aunts with anxiety and depression.   Family History: No family history on file.  Social History:  Social History   Socioeconomic History  . Marital status: Single    Spouse name: Not on file  . Number of children: Not on file  . Years of education: Not on file  . Highest education level: Not on file  Occupational History  . Not on file  Tobacco Use  . Smoking status: Never Smoker  . Smokeless tobacco: Never Used  Substance and Sexual Activity  . Alcohol use: Yes    Comment: 2 to 3 drinks on the weekend.   . Drug use: Not Currently  . Sexual activity: Not on file  Other Topics Concern  . Not on file  Social History Narrative  . Not on file   Social Determinants of Health   Financial Resource Strain:   . Difficulty of Paying Living Expenses:   Food Insecurity:   . Worried About Charity fundraiser in the Last Year:   . Arboriculturist in the Last Year:   Transportation Needs:   . Film/video editor (Medical):   Marland Kitchen Lack of Transportation (Non-Medical):   Physical Activity:   . Days of Exercise per Week:   . Minutes of Exercise per Session:   Stress:   . Feeling of Stress :   Social Connections:   . Frequency of Communication with Friends and Family:   . Frequency of Social Gatherings with Friends and Family:   . Attends Religious Services:   . Active Member  of Clubs or Organizations:   . Attends Archivist Meetings:   Marland Kitchen Marital Status:     Allergies: No Known Allergies  Metabolic Disorder Labs: No results found for: HGBA1C, MPG No results found for: PROLACTIN No results found for: CHOL, TRIG, HDL, CHOLHDL, VLDL, LDLCALC No results found for: TSH  Therapeutic Level Labs: No results found for: LITHIUM No results found for: VALPROATE No components found for:  CBMZ  Current Medications: Current Outpatient Medications  Medication Sig Dispense Refill  . lamoTRIgine (LAMICTAL) 100 MG tablet Take 1 tablet (100 mg  total) by mouth daily. 90 tablet 1  . traZODone (DESYREL) 50 MG tablet Take 1 tablet (50 mg total) by mouth at bedtime. 90 tablet 1  . TRINTELLIX 20 MG TABS tablet Take 1 tablet (20 mg total) by mouth daily. 90 tablet 1   No current facility-administered medications for this visit.    Medication Side Effects: none  Orders placed this visit:  No orders of the defined types were placed in this encounter.   Psychiatric Specialty Exam:  Review of Systems  Musculoskeletal: Negative for gait problem.  Neurological: Negative for tremors.  Psychiatric/Behavioral:       Please refer to HPI    Blood pressure 126/79, pulse 79, height _0  (1.727 m), weight 150 lb (68 kg).Body mass index is 22.81 kg/m.  General Appearance: Neat and Well Groomed  Eye Contact:  Good  Speech:  Clear and Coherent and Normal Rate  Volume:  Normal  Mood:  Euthymic  Affect:  Appropriate and Congruent  Thought Process:  Coherent and Descriptions of Associations: Intact  Orientation:  Full (Time, Place, and Person)  Thought Content: Logical   Suicidal Thoughts:  No  Homicidal Thoughts:  No  Memory:  WNL  Judgement:  Good  Insight:  Good  Psychomotor Activity:  Normal  Concentration:  Concentration: Good  Recall:  Good  Fund of Knowledge: Good  Language: Good  Assets:  Communication Skills Desire for Improvement Financial Resources/Insurance Housing Intimacy Leisure Time Physical Health Resilience Social Support Talents/Skills Transportation Vocational/Educational  ADL's:  Intact  Cognition: WNL  Prognosis:  Good   Screenings: No  Receiving Psychotherapy: No   Treatment Plan/Recommendations:   Plan:  PDMP reviewed  1. Trintellix 41m daily 2. Trazadone 527mat bedtime 3. Lamictal 10055mt hs  Read and reviewed note with patient for accuracy.   RTC 4 weeks  Patient advised to contact office with any questions, adverse effects, or acute worsening in signs and symptoms.  Counseled  patient regarding potential benefits, risks, and side effects of Lamictal to include potential risk of Stevens-Johnson syndrome. Advised patient to stop taking Lamictal and contact office immediately if rash develops and to seek urgent medical attention if rash is severe and/or spreading quickly.  Greater than 50% of face to face time with patient was spent on counseling and coordination of care. We discussed recent transition - new job, moving, support. Reviewed past psychiatric history. Discussed setting up with a therapist.        RegAloha GellP

## 2020-10-11 ENCOUNTER — Encounter: Payer: Self-pay | Admitting: Adult Health

## 2020-10-11 ENCOUNTER — Other Ambulatory Visit: Payer: Self-pay

## 2020-10-11 ENCOUNTER — Ambulatory Visit (INDEPENDENT_AMBULATORY_CARE_PROVIDER_SITE_OTHER): Payer: Commercial Managed Care - PPO | Admitting: Adult Health

## 2020-10-11 DIAGNOSIS — F411 Generalized anxiety disorder: Secondary | ICD-10-CM

## 2020-10-11 DIAGNOSIS — G4700 Insomnia, unspecified: Secondary | ICD-10-CM

## 2020-10-11 DIAGNOSIS — F331 Major depressive disorder, recurrent, moderate: Secondary | ICD-10-CM

## 2020-10-11 NOTE — Progress Notes (Signed)
Brenda Diaz [id number] [birth year] 24 y.o.  Subjective:   Patient ID:  Brenda Diaz is a 24 y.o. (DOB Aug 08, 1996) female.  Chief Complaint: No chief complaint on file.   HPI Brenda Diaz presents to the office today for follow-up of MDD, GAD, and insomnia.  Describes mood today as "ok". Pleasant. Mood symptoms - denies depression, anxiety, and irritability. Stating "I'm doing good". Mood has remained stable. Stable interest and motivation. Taking medications as prescribed.  Energy levels stable. Active, has a regular exercise routine - 3 to 4 times a week.   Enjoys some usual interests and activities. Lives alone. No pets. Family live in [place]. Spending time with friends. Talking with family. Appetite adequate. Eating healthy. Weight stable. Sleeps well most nights. Averages 9 hours. Focus and concentration stable. Completing tasks. Managing aspects of household. Works full-time. Work going well - works at [employer]  Denies SI or HI. Denies AH or VH.  Previous medication trials: Zoloft  Review of Systems:  Review of Systems  Musculoskeletal: Negative for gait problem.  Neurological: Negative for tremors.  Psychiatric/Behavioral:       Please refer to HPI    Medications: I have reviewed the patient's current medications.  Current Outpatient Medications  Medication Sig Dispense Refill   lamoTRIgine (LAMICTAL) 100 MG tablet Take 1 tablet (100 mg total) by mouth daily. 90 tablet 1   traZODone (DESYREL) 50 MG tablet Take 1 tablet (50 mg total) by mouth at bedtime. 90 tablet 1   TRINTELLIX 20 MG TABS tablet Take 1 tablet (20 mg total) by mouth daily. 90 tablet 1   No current facility-administered medications for this visit.    Medication Side Effects: None  Allergies: No Known Allergies  No past medical history on file.  No family history on file.  Social History   Socioeconomic History   Marital status: Single    Spouse name: Not on file   Number of  children: Not on file   Years of education: Not on file   Highest education level: Not on file  Occupational History   Not on file  Tobacco Use   Smoking status: Never Smoker   Smokeless tobacco: Never Used  Substance and Sexual Activity   Alcohol use: Yes    Comment: 2 to 3 drinks on the weekend.    Drug use: Not Currently   Sexual activity: Not on file  Other Topics Concern   Not on file  Social History Narrative   Not on file   Social Determinants of Health   Financial Resource Strain:    Difficulty of Paying Living Expenses: Not on file  Food Insecurity:    Worried About Running Out of Food in the Last Year: Not on file   Ran Out of Food in the Last Year: Not on file  Transportation Needs:    Lack of Transportation (Medical): Not on file   Lack of Transportation (Non-Medical): Not on file  Physical Activity:    Days of Exercise per Week: Not on file   Minutes of Exercise per Session: Not on file  Stress:    Feeling of Stress : Not on file  Social Connections:    Frequency of Communication with Friends and Family: Not on file   Frequency of Social Gatherings with Friends and Family: Not on file   Attends Religious Services: Not on file   Active Member of Clubs or Organizations: Not on file   Attends Banker Meetings: Not on file  Marital Status: Not on file  Intimate Partner Violence:    Fear of Current or Ex-Partner: Not on file   Emotionally Abused: Not on file   Physically Abused: Not on file   Sexually Abused: Not on file    Past Medical History, Surgical history, Social history, and Family history were reviewed and updated as appropriate.   Please see review of systems for further details on the patient's review from today.   Objective:   Physical Exam:  There were no vitals taken for this visit.  Physical Exam Constitutional:      General: She is not in acute distress. Musculoskeletal:        General: No  deformity.  Neurological:     Mental Status: She is alert and oriented to person, place, and time.     Coordination: Coordination normal.  Psychiatric:        Attention and Perception: Attention and perception normal. She does not perceive auditory or visual hallucinations.        Mood and Affect: Mood normal. Mood is not anxious or depressed. Affect is not labile, blunt, angry or inappropriate.        Speech: Speech normal.        Behavior: Behavior normal.        Thought Content: Thought content normal. Thought content is not paranoid or delusional. Thought content does not include homicidal or suicidal ideation. Thought content does not include homicidal or suicidal plan.        Cognition and Memory: Cognition and memory normal.        Judgment: Judgment normal.     Comments: Insight intact     Lab Review:  No results found for: NA, K, CL, CO2, GLUCOSE, BUN, CREATININE, CALCIUM, PROT, ALBUMIN, AST, ALT, ALKPHOS, BILITOT, GFRNONAA, GFRAA  No results found for: WBC, RBC, HGB, HCT, PLT, MCV, MCH, MCHC, RDW, LYMPHSABS, MONOABS, EOSABS, BASOSABS  No results found for: POCLITH, LITHIUM   No results found for: PHENYTOIN, PHENOBARB, VALPROATE, CBMZ   .res Assessment: Plan:    Plan:  PDMP reviewed  1. Trintellix 20mg  daily 2. Trazadone 50mg  at bedtime 3. Lamictal 100mg  at hs  Read and reviewed note with patient for accuracy.   RTC 3 months  Patient advised to contact office with any questions, adverse effects, or acute worsening in signs and symptoms.  Counseled patient regarding potential benefits, risks, and side effects of Lamictal to include potential risk of Stevens-Johnson syndrome. Advised patient to stop taking Lamictal and contact office immediately if rash develops and to seek urgent medical attention if rash is severe and/or spreading quickly.    Diagnoses and all orders for this visit:  Major depressive disorder, recurrent episode, moderate (HCC)  Generalized  anxiety disorder  Insomnia, unspecified type     Please see After Visit Summary for patient specific instructions.  Future Appointments  Date Time Provider Department Center  01/11/2021  4:20 PM Tierra Thoma, , NP [facility] None    No orders of the defined types were placed in this encounter.   -------------------------------

## 2020-10-14 ENCOUNTER — Ambulatory Visit (HOSPITAL_COMMUNITY)
Admission: EM | Admit: 2020-10-14 | Discharge: 2020-10-14 | Disposition: A | Discharge status: Home / Self Care | Payer: Commercial Managed Care - PPO

## 2020-10-14 ENCOUNTER — Encounter (HOSPITAL_COMMUNITY): Payer: Self-pay

## 2020-10-14 ENCOUNTER — Other Ambulatory Visit: Payer: Self-pay

## 2020-10-14 DIAGNOSIS — M25562 Pain in left knee: Secondary | ICD-10-CM

## 2020-10-14 NOTE — Discharge Instructions (Signed)
Alternate tylenol and ibuprofen as needed, rest, ice off and on, elevate the leg

## 2020-10-14 NOTE — ED Provider Notes (Signed)
[facility]    CSN: [id number] Arrival date & time: 10/14/20  1202      History   Chief Complaint Chief Complaint  Patient presents with   Knee Pain    Left     HPI Brenda Diaz is a 24 y.o. female.   Patient presenting today with 1 day hx of posterior left knee pain that started yesterday doing some jumping jacks yesterday. She states she felt something pop during the move and now feels like her knee is jello and has pain with extension of the knee. Denies swelling, bruising, deformity, numbness, tingling. Has not tried anything for sxs. No known hx of joint issues.      History reviewed. No pertinent past medical history.  There are no problems to display for this patient.   History reviewed. No pertinent surgical history.  OB History   No obstetric history on file.      Home Medications    Prior to Admission medications   Medication Sig Start Date End Date Taking? Authorizing Provider  lamoTRIgine (LAMICTAL) 100 MG tablet Take 1 tablet (100 mg total) by mouth daily. 07/11/20   Mozingo, Thereasa Solo, NP  traZODone (DESYREL) 50 MG tablet Take 1 tablet (50 mg total) by mouth at bedtime. 07/11/20   Mozingo, Thereasa Solo, NP  TRINTELLIX 20 MG TABS tablet Take 1 tablet (20 mg total) by mouth daily. 07/11/20   Mozingo, Thereasa Solo, NP    Family History History reviewed. No pertinent family history.  Social History Social History   Tobacco Use   Smoking status: Never Smoker   Smokeless tobacco: Never Used  Substance Use Topics   Alcohol use: Yes    Comment: 2 to 3 drinks on the weekend.    Drug use: Not Currently     Allergies   Patient has no known allergies.   Review of Systems Review of Systems PER HPI   Physical Exam Triage Vital Signs ED Triage Vitals  Enc Vitals Group     BP 10/14/20 1324 115/69     Pulse Rate 10/14/20 1324 63     Resp 10/14/20 1324 18     Temp 10/14/20 1324 98 F (36.7 C)     Temp Source 10/14/20  1324 Oral     SpO2 --      Weight --      Height --      Head Circumference --      Peak Flow --      Pain Score 10/14/20 1323 5     Pain Loc --      Pain Edu? --      Excl. in GC? --    No data found.  Updated Vital Signs BP 115/69 (BP Location: Right Arm)    Pulse 63    Temp 98 F (36.7 C) (Oral)    Resp 18    LMP 09/23/2020   Visual Acuity Right Eye Distance:   Left Eye Distance:   Bilateral Distance:    Right Eye Near:   Left Eye Near:    Bilateral Near:     Physical Exam Vitals and nursing note reviewed.  Constitutional:      Appearance: Normal appearance. She is not ill-appearing.  HENT:     Head: Atraumatic.  Eyes:     Extraocular Movements: Extraocular movements intact.     Conjunctiva/sclera: Conjunctivae normal.  Cardiovascular:     Rate and Rhythm: Normal rate and regular rhythm.  Pulses: Normal pulses.     Heart sounds: Normal heart sounds.  Pulmonary:     Effort: Pulmonary effort is normal.     Breath sounds: Normal breath sounds.  Musculoskeletal:        General: No swelling, tenderness (left knee diffusely nontender to palpation) or deformity. Normal range of motion.     Cervical back: Normal range of motion and neck supple.     Comments: Gait mildly antalgic but overall intact No pain with passive ROM of left knee, mild pain with active extension Neg mcmurrays, neg drawer testing  Skin:    General: Skin is warm and dry.  Neurological:     Mental Status: She is alert and oriented to person, place, and time.     Sensory: No sensory deficit.  Psychiatric:        Mood and Affect: Mood normal.        Thought Content: Thought content normal.        Judgment: Judgment normal.      UC Treatments / Results  Labs (all labs ordered are listed, but only abnormal results are displayed) Labs Reviewed - No data to display  EKG   Radiology No results found.  Procedures Procedures (including critical care time)  Medications Ordered in  UC Medications - No data to display  Initial Impression / Assessment and Plan / UC Course  I have reviewed the triage vital signs and the nursing notes.  Pertinent labs & imaging results that were available during my care of the patient were reviewed by me and considered in my medical decision making (see chart for details).     Suspect muscular strain based on physical exam, no evidence of joint laxity or meniscus injury, no deformity, neurovascularly intact. Discussed OTC pain relievers, RICE, work note given. F/u if not improving.   Final Clinical Impressions(s) / UC Diagnoses   Final diagnoses:  Acute pain of left knee     Discharge Instructions     Alternate tylenol and ibuprofen as needed, rest, ice off and on, elevate the leg    ED Prescriptions    None     PDMP not reviewed this encounter.   Particia Nearing, [place] 10/14/20 1406

## 2020-10-14 NOTE — ED Triage Notes (Signed)
Pt present left knee pain, pt states she was working out and felt a pop in her  Knee and now she is having trouble walking and extending her left knee.

## 2020-11-22 ENCOUNTER — Ambulatory Visit: Payer: Self-pay

## 2020-11-22 ENCOUNTER — Other Ambulatory Visit: Payer: Self-pay

## 2020-11-22 ENCOUNTER — Ambulatory Visit (INDEPENDENT_AMBULATORY_CARE_PROVIDER_SITE_OTHER): Payer: Commercial Managed Care - PPO | Admitting: Orthopaedic Surgery

## 2020-11-22 DIAGNOSIS — M25562 Pain in left knee: Secondary | ICD-10-CM

## 2020-11-22 NOTE — Progress Notes (Signed)
   Office Visit Note   Patient: Brenda Diaz           Date of Birth: 1996/01/10           MRN: [id number] Visit Date: 11/22/2020              Requested by: No referring provider defined for this encounter. PCP: Patient, No Pcp Per   Assessment & Plan: Visit Diagnoses:  1. Left knee pain, unspecified chronicity     Plan: Impression is left knee medial meniscus tear.  At this point, recommended MRI to further assess for structural abnormalities.  She will follow up with [place] once that has been completed.  Follow-Up Instructions: Return for after MRI.   Orders:  Orders Placed This Encounter  Procedures  . XR KNEE 3 VIEW LEFT  . MR Knee Left w/o contrast   No orders of the defined types were placed in this encounter.     Procedures: No procedures performed   Clinical Data: No additional findings.   Subjective: Chief Complaint  Patient presents with  . Left Knee - Pain    HPI patient is a pleasant 24 year old girl who comes in today following an injury to her left knee.  Approximately 1 month ago while doing jumping jacks she came down into a valgus stress where she heard a pop and had pain to the medial knee.  She has had pain to the medial aspect since.  This is worse with squatting, jumping or pivoting.  She denies any locking or catching but does note instability with activity.  Review of Systems as detailed in HPI.  All others reviewed and are negative.   Objective: Vital Signs: There were no vitals taken for this visit.  Physical Exam well-developed well-nourished female no acute distress.  Alert oriented x3.  Ortho Exam examination of the left knee shows no effusion.  Range of motion 0 to 120 degrees.  She does have tenderness to the posterior medial joint line.  Very mild tenderness along the MCL.  She is stable to valgus and varus stressing both are without pain.  She does have slight laxity with anterior drawer compared to the right knee.  She is  neurovascular intact distally.  Specialty Comments:  No specialty comments available.  Imaging: XR KNEE 3 VIEW LEFT  Result Date: 11/22/2020 No acute or structural abnormalities    PMFS History: There are no problems to display for this patient.  No past medical history on file.  No family history on file.  No past surgical history on file. Social History   Occupational History  . Not on file  Tobacco Use  . Smoking status: Never Smoker  . Smokeless tobacco: Never Used  Substance and Sexual Activity  . Alcohol use: Yes    Comment: 2 to 3 drinks on the weekend.   . Drug use: Not Currently  . Sexual activity: Not on file

## 2020-12-19 ENCOUNTER — Other Ambulatory Visit: Payer: Commercial Managed Care - PPO

## 2021-01-01 ENCOUNTER — Ambulatory Visit
Admission: RE | Admit: 2021-01-01 | Discharge: 2021-01-01 | Disposition: A | Discharge status: Home / Self Care | Payer: Commercial Managed Care - PPO | Source: Ambulatory Visit | Attending: Orthopaedic Surgery | Admitting: Orthopaedic Surgery

## 2021-01-01 ENCOUNTER — Other Ambulatory Visit: Payer: Self-pay

## 2021-01-01 DIAGNOSIS — M25562 Pain in left knee: Secondary | ICD-10-CM

## 2021-01-01 NOTE — Progress Notes (Signed)
Needs appt.  Thanks.

## 2021-01-03 ENCOUNTER — Encounter: Payer: Self-pay | Admitting: Orthopaedic Surgery

## 2021-01-03 ENCOUNTER — Ambulatory Visit: Payer: Commercial Managed Care - PPO | Admitting: Orthopaedic Surgery

## 2021-01-03 DIAGNOSIS — S83512A Sprain of anterior cruciate ligament of left knee, initial encounter: Secondary | ICD-10-CM

## 2021-01-03 NOTE — Progress Notes (Addendum)
   Office Visit Note   Patient: Brenda Diaz           Date of Birth: July 11, 1996           MRN: [id number] Visit Date: 01/03/2021              Requested by: No referring provider defined for this encounter. PCP: Patient, No Pcp Per   Assessment & Plan: Visit Diagnoses:  1. New ACL tear, left, initial encounter     Plan: MRI shows a partial tear likely involving the posterior lateral bundle.  No pivot shift bony contusion.  Collaterals are stable.  These findings were reviewed with the patient today.  She can continue with stationary bike and elliptical.  She is not released to running yet.  I have made a referral for outpatient PT at our office.  We will get her set up with a functional ACL brace.  Follow-up in 6 weeks for recheck.  Needs custom brace to due to thigh to calf ratio.   Follow-Up Instructions: Return in about 6 weeks (around 02/14/2021).   Orders:  Orders Placed This Encounter  Procedures  . Ambulatory referral to Physical Therapy   No orders of the defined types were placed in this encounter.     Procedures: No procedures performed   Clinical Data: No additional findings.   Subjective: Chief Complaint  Patient presents with  . Left Knee - Pain    Brenda Diaz returns today for MRI review of the left knee.  Overall she is feels better.  She recently slipped on ice and fell directly on her left knee.  She is not feeling any sensation of instability or giving way.   Review of Systems   Objective: Vital Signs: There were no vitals taken for this visit.  Physical Exam  Ortho Exam Left knee shows no joint effusion.  There is bruise on the front of the knee from recent fall.  ACL testing shows 1+ Lachman and anterior drawer with solid endpoint.  Negative pivot shift.  Excellent range of motion.  No joint effusion. Specialty Comments:  No specialty comments available.  Imaging: No results found.   PMFS History: Patient Active Problem List   Diagnosis  Date Noted  . New ACL tear, left, initial encounter 01/03/2021   History reviewed. No pertinent past medical history.  History reviewed. No pertinent family history.  History reviewed. No pertinent surgical history. Social History   Occupational History  . Not on file  Tobacco Use  . Smoking status: Never Smoker  . Smokeless tobacco: Never Used  Substance and Sexual Activity  . Alcohol use: Yes    Comment: 2 to 3 drinks on the weekend.   . Drug use: Not Currently  . Sexual activity: Not on file

## 2021-01-11 ENCOUNTER — Other Ambulatory Visit: Payer: Self-pay

## 2021-01-11 ENCOUNTER — Ambulatory Visit: Payer: Commercial Managed Care - PPO | Admitting: Physical Therapy

## 2021-01-11 ENCOUNTER — Other Ambulatory Visit: Payer: Commercial Managed Care - PPO

## 2021-01-11 ENCOUNTER — Encounter: Payer: Self-pay | Admitting: Physical Therapy

## 2021-01-11 ENCOUNTER — Ambulatory Visit (INDEPENDENT_AMBULATORY_CARE_PROVIDER_SITE_OTHER): Payer: Commercial Managed Care - PPO | Admitting: Adult Health

## 2021-01-11 ENCOUNTER — Encounter: Payer: Self-pay | Admitting: Adult Health

## 2021-01-11 DIAGNOSIS — G4700 Insomnia, unspecified: Secondary | ICD-10-CM | POA: Diagnosis not present

## 2021-01-11 DIAGNOSIS — M6281 Muscle weakness (generalized): Secondary | ICD-10-CM

## 2021-01-11 DIAGNOSIS — F411 Generalized anxiety disorder: Secondary | ICD-10-CM

## 2021-01-11 DIAGNOSIS — M25562 Pain in left knee: Secondary | ICD-10-CM

## 2021-01-11 DIAGNOSIS — F331 Major depressive disorder, recurrent, moderate: Secondary | ICD-10-CM | POA: Diagnosis not present

## 2021-01-11 MED ORDER — TRAZODONE HCL 50 MG PO TABS: 50.0000 mg | ORAL_TABLET | Freq: Every day | ORAL | 1 refills | Status: DC

## 2021-01-11 MED ORDER — TRINTELLIX 20 MG PO TABS: 20.0000 mg | ORAL_TABLET | Freq: Every day | ORAL | 1 refills | Status: DC

## 2021-01-11 MED ORDER — LAMOTRIGINE 100 MG PO TABS: 100.0000 mg | ORAL_TABLET | Freq: Every day | ORAL | 1 refills | Status: DC

## 2021-01-11 NOTE — Therapy (Signed)
[facility] Physical Therapy [address] [place], [place], [address] Phone: [phone]   Fax:  [phone]  Physical Therapy Evaluation  Patient Details  Name: Brenda Diaz MRN: [id number] Date of Birth: 1996-07-17 Referring Provider (PT): Dr. Roda Shutters   Encounter Date: 01/11/2021   PT End of Session - 01/11/21 1043    Visit Number 1    Number of Visits 6    Date for PT Re-Evaluation 03/08/21    Authorization Type [employer] UMR $50 copay    PT Start Time 0845    PT Stop Time 0928    PT Time Calculation (min) 43 min    Activity Tolerance Patient tolerated treatment well    Behavior During Therapy [facility] for tasks assessed/performed           History reviewed. No pertinent past medical history.  History reviewed. No pertinent surgical history.  There were no vitals filed for this visit.    Subjective Assessment - 01/11/21 0848    Subjective Pt is a 25 y/o female who presents to OPPT for partial Lt ACL tear.  She reports about 2 months ago she was doing jumping jacks at the gym and felt a "pop" in her Lt knee.  Over time pain improved but she continued to feel some instability.  She's unable to run and squat due to pain and instability.  MRI demonstrated partial ACL tear, so plan to tx conservatively.    Pertinent History depression    Limitations Standing    Diagnostic tests MRI: partial ACL tear    Patient Stated Goals improve pain, stability, return to running/squats    Currently in Pain? No/denies    Pain Score 4     Pain Location Knee    Pain Orientation Left    Pain Descriptors / Indicators Aching;Dull    Pain Type Acute pain    Pain Onset More than a month ago    Pain Frequency Intermittent    Aggravating Factors  walking, stairs, standing    Pain Relieving Factors rest, sleep              [facility] PT Assessment - 01/11/21 0852      Assessment   Medical Diagnosis Partial Lt ACL tear    Referring Provider (PT) Dr. Roda Shutters    Onset Date/Surgical Date  09/23/20    Hand Dominance Right    Next MD Visit 02/14/21    Prior Therapy none      Precautions   Precautions None      Restrictions   Weight Bearing Restrictions No      Balance Screen   Has the patient fallen in the past 6 months No    Has the patient had a decrease in activity level because of a fear of falling?  Yes    Is the patient reluctant to leave their home because of a fear of falling?  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; does walk the Hotel manager occasionally,  lifting up to 10# occasionally    Leisure gaming, spending time with friends; prior to injury gym 3-4x/wk running, weight lifting / currently limited gym activity - bike/arm work      Cognition   Overall Cognitive Status Within Functional Limits for tasks assessed      Observation/Other Assessments   Focus on Therapeutic Outcomes (FOTO)  55 (predicted 71)      Functional Tests   Functional tests Single Leg Squat      Single Leg Squat   Comments LLE with internal rotation noted - able to correct with mod cues      ROM / Strength   AROM / PROM / Strength AROM;Strength      AROM   AROM Assessment Site Knee    Right/Left Knee Left    Left Knee Extension -2    Left Knee Flexion 131      Strength   Strength Assessment Site Hip;Knee    Right/Left Hip Left    Left Hip ADduction 4/5    Right/Left Knee Left    Left Knee Flexion 4/5    Left Knee Extension 4/5                      Objective measurements completed on examination: See above findings.       [facility] Adult PT  Treatment/Exercise - 01/11/21 0934      Exercises   Exercises Other Exercises    Other Exercises  See pt instructions-pt performed 3-5 reps of each exercise for instruction (except knee flexion/ext machine and cardio equipment); min cues needed for exercises                  PT Education - 01/11/21 0936    Education Details HEP    Person(s) Educated Patient    Methods Explanation;Demonstration;Handout    Comprehension Verbalized understanding;Returned demonstration;Need further instruction            PT Short Term Goals - 01/11/21 1052      PT SHORT TERM GOAL #1   Title report pain <2/10 with activity for improved function    Status New    Target Date 02/08/21             PT Long Term Goals - 01/11/21 1053      PT LONG TERM GOAL #1   Title independent with HEP    Status New    Target Date 03/08/21      PT LONG TERM GOAL #2   Title demonstrate 5/5 LLE strength for improved function    Status New    Target Date 03/08/21      PT LONG TERM GOAL #3   Title negotiate stairs without increase in pain for improved function    Status New    Target Date 03/08/21                  Plan - 01/11/21 1045    Clinical Impression Statement Pt i a 26 y/o female who presents to OPPT with partial Lt ACL tear demonstrating decrease strength and continued pain.  Pt will benefit from PT to address deficits listed.  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    Personal Factors and Comorbidities Comorbidity 1    Comorbidities depression    Examination-Activity Limitations Squat;Stairs;Stand;Locomotion Level    Examination-Participation Restrictions Community Activity;Occupation    Stability/Clinical Decision Making Stable/Uncomplicated  Clinical Decision Making Low    Rehab Potential Excellent    PT Frequency 1x / week   up to 1x/wk   PT Duration 8 weeks    PT Treatment/Interventions ADLs/Self Care Home  Management;Cryotherapy;Electrical Stimulation;Moist Heat;Balance training;Therapeutic exercise;Therapeutic activities;Functional mobility training;Gait training;Stair training;Neuromuscular re-education;Patient/family education;Manual techniques;Vasopneumatic Device;Taping;Dry needling    PT Next Visit Plan review strengthening program and update, ? BFR if needed, manual/modalities PRN for pain    PT Home Exercise Plan Access Code: [email or web address]    Consulted and Agree with Plan of Care Patient           Patient will benefit from skilled therapeutic intervention in order to improve the following deficits and impairments:  Pain,Decreased strength,Decreased mobility,Decreased activity tolerance  Visit Diagnosis: Acute pain of left knee - Plan: PT plan of care cert/re-cert  Muscle weakness (generalized) - Plan: PT plan of care cert/re-cert     Problem List Patient Active Problem List   Diagnosis Date Noted  . New ACL tear, left, initial encounter 01/03/2021      Clarita Crane, PT, DPT 01/11/21 10:58 AM     [facility] Physical Therapy [address] [place], [place], [address] Phone: [phone]   Fax:  [phone]  Name: Brenda Diaz MRN: [id number] Date of Birth: 09/23/1996

## 2021-01-11 NOTE — Patient Instructions (Signed)
Access Code: [email or web address] URL: [email or web address] Date: 01/11/2021 Prepared by: Moshe Cipro  Exercises Elliptical - 1 x daily - 3-4 x weekly - 1 sets - 1 reps Upright Bike - 1 x daily - 3-4 x weekly - 1 sets - 1 reps Single Leg Knee Extension with Weight Machine - 1 x daily - 3-4 x weekly - 3 sets - 10 reps Single Leg Hamstring Curl with Weight Machine - 1 x daily - 3-4 x weekly - 3 sets - 10 reps Single Leg Press - 1 x daily - 3-4 x weekly - 3 sets - 10 reps Sidelying Hip Adduction - 1 x daily - 7 x weekly - 3 sets - 10 reps Straight Leg Raise with External Rotation - 3 x daily - 7 x weekly - 1 sets - 10 reps - 2-3 sec hold Single Leg Hamstring Curl with TRX - 1 x daily - 3-4 x weekly - 3 sets - 10 reps Sidelying Hip Adduction with TRX - 1 x daily - 3-4 x weekly - 3 sets - 10 reps Squat with TRX - 1 x daily - 3-4 x weekly - 3 sets - 10 reps Lateral Lunge with TRX - 1 x daily - 3-4 x weekly - 3 sets - 10 reps Assisted Lunge with TRX - 1 x daily - 3-4 x weekly - 3 sets - 10 reps Single Leg Hip Hinge with TRX - 1 x daily - 3-4 x weekly - 3 sets - 10 reps

## 2021-01-11 NOTE — Progress Notes (Signed)
Brenda Diaz [id number] [birth year] 25 y.o.  Subjective:   Patient ID:  Brenda Diaz is a 25 y.o. (DOB [birth year]) female.  Chief Complaint: No chief complaint on file.   HPI Brenda Diaz presents to the office today for follow-up of MDD, GAD, and insomnia.  Describes mood today as "ok". Pleasant. Mood symptoms - denies depression, anxiety, and irritability. Stating "I'm doing really good". Mood level. Stable interest and motivation. Taking medications as prescribed.  Energy levels stable. Active, does not have a regular exercise routine with a torn ACL.  Enjoys some usual interests and activities. Lives alone. No pets. Family live in [place]. Spending time with friends. Talking with family. Appetite adequate. Eating healthy. Weight gain - 150 pounds. Sleeps well most nights. Averages 9 hours. Focus and concentration stable. Completing tasks. Managing aspects of household. Works full-time at [employer]  Denies SI or HI.  Denies AH or VH.  Previous medication trials: Zoloft  Review of Systems:  Review of Systems  Musculoskeletal: Negative for gait problem.  Neurological: Negative for tremors.  Psychiatric/Behavioral:       Please refer to HPI    Medications: I have reviewed the patient's current medications.  Current Outpatient Medications  Medication Sig Dispense Refill  . lamoTRIgine (LAMICTAL) 100 MG tablet Take 1 tablet (100 mg total) by mouth daily. 90 tablet 1  . traZODone (DESYREL) 50 MG tablet Take 1 tablet (50 mg total) by mouth at bedtime. 90 tablet 1  . TRINTELLIX 20 MG TABS tablet Take 1 tablet (20 mg total) by mouth daily. 90 tablet 1   No current facility-administered medications for this visit.    Medication Side Effects: None  Allergies: No Known Allergies  No past medical history on file.  No family history on file.  Social History   Socioeconomic History  . Marital status: Single    Spouse name: Not on file  . Number of children: Not on file   . Years of education: Not on file  . Highest education level: Not on file  Occupational History  . Not on file  Tobacco Use  . Smoking status: Never Smoker  . Smokeless tobacco: Never Used  Substance and Sexual Activity  . Alcohol use: Yes    Comment: 2 to 3 drinks on the weekend.   . Drug use: Not Currently  . Sexual activity: Not on file  Other Topics Concern  . Not on file  Social History Narrative  . Not on file   Social Determinants of Health   Financial Resource Strain: Not on file  Food Insecurity: Not on file  Transportation Needs: Not on file  Physical Activity: Not on file  Stress: Not on file  Social Connections: Not on file  Intimate Partner Violence: Not on file    Past Medical History, Surgical history, Social history, and Family history were reviewed and updated as appropriate.   Please see review of systems for further details on the patient's review from today.   Objective:   Physical Exam:  There were no vitals taken for this visit.  Physical Exam Constitutional:      General: She is not in acute distress. Musculoskeletal:        General: No deformity.  Neurological:     Mental Status: She is alert and oriented to person, place, and time.     Coordination: Coordination normal.  Psychiatric:        Attention and Perception: Attention and perception normal. She does not perceive auditory or  visual hallucinations.        Mood and Affect: Mood normal. Mood is not anxious or depressed. Affect is not labile, blunt, angry or inappropriate.        Speech: Speech normal.        Behavior: Behavior normal.        Thought Content: Thought content normal. Thought content is not paranoid or delusional. Thought content does not include homicidal or suicidal ideation. Thought content does not include homicidal or suicidal plan.        Cognition and Memory: Cognition and memory normal.        Judgment: Judgment normal.     Comments: Insight intact     Lab  Review:  No results found for: NA, K, CL, CO2, GLUCOSE, BUN, CREATININE, CALCIUM, PROT, ALBUMIN, AST, ALT, ALKPHOS, BILITOT, GFRNONAA, GFRAA  No results found for: WBC, RBC, HGB, HCT, PLT, MCV, MCH, MCHC, RDW, LYMPHSABS, MONOABS, EOSABS, BASOSABS  No results found for: POCLITH, LITHIUM   No results found for: PHENYTOIN, PHENOBARB, VALPROATE, CBMZ   .res Assessment: Plan:    Plan:  PDMP reviewed  1. Trintellix 20mg  daily 2. Trazadone 50mg  at bedtime 3. Lamictal 100mg  at hs   Read and reviewed note with patient for accuracy.   RTC 5 months  Patient advised to contact office with any questions, adverse effects, or acute worsening in signs and symptoms.  Counseled patient regarding potential benefits, risks, and side effects of Lamictal to include potential risk of Stevens-Johnson syndrome. Advised patient to stop taking Lamictal and contact office immediately if rash develops and to seek urgent medical attention if rash is severe and/or spreading quickly.   Diagnoses and all orders for this visit:  Insomnia, unspecified type -     traZODone (DESYREL) 50 MG tablet; Take 1 tablet (50 mg total) by mouth at bedtime.  Major depressive disorder, recurrent episode, moderate (HCC) -     TRINTELLIX 20 MG TABS tablet; Take 1 tablet (20 mg total) by mouth daily. -     lamoTRIgine (LAMICTAL) 100 MG tablet; Take 1 tablet (100 mg total) by mouth daily.  Generalized anxiety disorder -     TRINTELLIX 20 MG TABS tablet; Take 1 tablet (20 mg total) by mouth daily.     Please see After Visit Summary for patient specific instructions.  Future Appointments  Date Time Provider Department Center  02/03/2021  8:00 AM , PT [facility] None  02/14/2021  8:15 AM 02/05/2021, MD [facility] None    No orders of the defined types were placed in this encounter.   -------------------------------

## 2021-02-03 ENCOUNTER — Ambulatory Visit (INDEPENDENT_AMBULATORY_CARE_PROVIDER_SITE_OTHER): Payer: Commercial Managed Care - PPO | Admitting: Physical Therapy

## 2021-02-03 ENCOUNTER — Other Ambulatory Visit: Payer: Self-pay

## 2021-02-03 ENCOUNTER — Encounter: Payer: Self-pay | Admitting: Physical Therapy

## 2021-02-03 DIAGNOSIS — M25562 Pain in left knee: Secondary | ICD-10-CM | POA: Diagnosis not present

## 2021-02-03 DIAGNOSIS — M6281 Muscle weakness (generalized): Secondary | ICD-10-CM | POA: Diagnosis not present

## 2021-02-03 NOTE — Patient Instructions (Signed)
Access Code: [email or web address] URL: [email or web address] Date: 02/03/2021 Prepared by: Moshe Cipro  Exercises Elliptical - 1 x daily - 3-4 x weekly - 1 sets - 1 reps Upright Bike - 1 x daily - 3-4 x weekly - 1 sets - 1 reps Single Leg Knee Extension with Weight Machine - 1 x daily - 3-4 x weekly - 3 sets - 10 reps Single Leg Hamstring Curl with Weight Machine - 1 x daily - 3-4 x weekly - 3 sets - 10 reps Single Leg Press - 1 x daily - 3-4 x weekly - 3 sets - 10 reps Sidelying Hip Adduction - 1 x daily - 7 x weekly - 3 sets - 10 reps Straight Leg Raise with External Rotation - 3 x daily - 7 x weekly - 1 sets - 10 reps - 2-3 sec hold Wall Squat with Swiss Ball - 1 x daily - 7 x weekly - 3 sets - 10 reps - 3-5 sec hold Single Leg Deadlift with Kettlebell - 1 x daily - 7 x weekly - 3 sets - 10 reps Lateral Lunge - 1 x daily - 7 x weekly - 3 sets - 10 reps Supine Hamstring Curl on Swiss Ball - 1 x daily - 7 x weekly - 3 sets - 10 reps

## 2021-02-03 NOTE — Therapy (Signed)
[facility] Physical Therapy [address] [place], [place], [address] Phone: [phone]   Fax:  [phone]  Physical Therapy Treatment  Patient Details  Name: Brenda Diaz MRN: [id number] Date of Birth: [birth year] Referring Provider (PT): Dr. Roda Shutters   Encounter Date: 02/03/2021   PT End of Session - 02/03/21 0843    Visit Number 2    Number of Visits 6    Date for PT Re-Evaluation 03/08/21    Authorization Type UHC UMR $50 copay    PT Start Time 0804    PT Stop Time 0844    PT Time Calculation (min) 40 min    Activity Tolerance Patient tolerated treatment well    Behavior During Therapy [facility] for tasks assessed/performed           History reviewed. No pertinent past medical history.  History reviewed. No pertinent surgical history.  There were no vitals filed for this visit.   Subjective Assessment - 02/03/21 0806    Subjective Reports she "doesn't feel much of a difference."  Hasn't had much time to go to [employer], but going to apartment gym 3x/wk    Pertinent History depression    Limitations Standing    Diagnostic tests MRI: partial ACL tear    Patient Stated Goals improve pain, stability, return to running/squats    Currently in Pain? Yes    Pain Score 3     Pain Location Knee    Pain Orientation Left    Pain Descriptors / Indicators Aching;Dull    Pain Type Acute pain    Pain Onset More than a month ago    Pain Frequency Intermittent    Aggravating Factors  walking, stairs, standing    Pain Relieving Factors rest, sleep                             [facility] Adult PT Treatment/Exercise - 02/03/21 0808      Exercises   Exercises Knee/Hip      Knee/Hip Exercises: Aerobic   Recumbent Bike L4 x 5 min      Knee/Hip Exercises: Standing   Side Lunges Both;2 sets;10 reps   5#   Wall Squat 2 sets;10 reps;5 seconds   with ball squeeze; red pball behind pt   Other Standing Knee Exercises LLE deadlift 5# x 10; 10# x 10       Knee/Hip Exercises: Seated   Other Seated Knee/Hip Exercises SLR with ER x20 reps; LLE      Knee/Hip Exercises: Sidelying   Hip ADduction Left;20 reps    Hip ADduction Limitations with pulse at end range                  PT Education - 02/03/21 0843    Education Details HEP    Person(s) Educated Patient    Methods Explanation;Demonstration;Handout    Comprehension Verbalized understanding;Returned demonstration;Need further instruction            PT Short Term Goals - 02/03/21 0844      PT SHORT TERM GOAL #1   Title report pain <2/10 with activity for improved function    Status On-going    Target Date 02/08/21             PT Long Term Goals - 02/03/21 0844      PT LONG TERM GOAL #1   Title independent with HEP    Status On-going    Target Date 03/08/21  PT LONG TERM GOAL #2   Title demonstrate 5/5 LLE strength for improved function    Status On-going      PT LONG TERM GOAL #3   Title negotiate stairs without increase in pain for improved function    Status On-going                 Plan - 02/03/21 0844    Clinical Impression Statement Pt tolerated session well today with modifications to HEP as she doesn't have access to TRX.  Will continue to benefit from PT to maximize function.    Personal Factors and Comorbidities Comorbidity 1    Comorbidities depression    Examination-Activity Limitations Squat;Stairs;Stand;Locomotion Level    Examination-Participation Restrictions Community Activity;Occupation    Stability/Clinical Decision Making Stable/Uncomplicated    Rehab Potential Excellent    PT Frequency 1x / week   up to 1x/wk   PT Duration 8 weeks    PT Treatment/Interventions ADLs/Self Care Home Management;Cryotherapy;Electrical Stimulation;Moist Heat;Balance training;Therapeutic exercise;Therapeutic activities;Functional mobility training;Gait training;Stair training;Neuromuscular re-education;Patient/family education;Manual  techniques;Vasopneumatic Device;Taping;Dry needling    PT Next Visit Plan review strengthening program and update, ? BFR if needed, manual/modalities PRN for pain    PT Home Exercise Plan Access Code: [email or web address]    Consulted and Agree with Plan of Care Patient           Patient will benefit from skilled therapeutic intervention in order to improve the following deficits and impairments:  Pain,Decreased strength,Decreased mobility,Decreased activity tolerance  Visit Diagnosis: Acute pain of left knee  Muscle weakness (generalized)     Problem List Patient Active Problem List   Diagnosis Date Noted  . New ACL tear, left, initial encounter 01/03/2021       Clarita Crane, PT, DPT 02/03/21 8:47 AM    [facility] Physical Therapy [address] [place], [place], [address] Phone: [phone]   Fax:  [phone]  Name: Brenda Diaz MRN: [id number] Date of Birth: [birth year]

## 2021-02-14 ENCOUNTER — Ambulatory Visit (INDEPENDENT_AMBULATORY_CARE_PROVIDER_SITE_OTHER): Payer: Commercial Managed Care - PPO | Admitting: Orthopaedic Surgery

## 2021-02-14 ENCOUNTER — Other Ambulatory Visit: Payer: Self-pay

## 2021-02-14 DIAGNOSIS — S83512A Sprain of anterior cruciate ligament of left knee, initial encounter: Secondary | ICD-10-CM

## 2021-02-14 NOTE — Progress Notes (Signed)
   Office Visit Note   Patient: Brenda Diaz           Date of Birth: July 17, 1996           MRN: [id number] Visit Date: 02/14/2021              Requested by: No referring provider defined for this encounter. PCP: Patient, No Pcp Per   Assessment & Plan: Visit Diagnoses:  1. New ACL tear, left, initial encounter     Plan: Impression is 4 weeks status post left knee partial ACL tear.  She is continuing to do well.  Continue with physical therapy.  She will continue to avoid any pivoting or running until she is 6 months out from initial injury.  She will follow up with [place] in 2 months time for recheck.  Follow-Up Instructions: Return in about 2 months (around 04/16/2021).   Orders:  No orders of the defined types were placed in this encounter.  No orders of the defined types were placed in this encounter.     Procedures: No procedures performed   Clinical Data: No additional findings.   Subjective: Chief Complaint  Patient presents with  . Left Knee - Pain    HPI patient is a very pleasant 25 year old female who comes in today nearly 4 months out left knee partial ACL tear.  She has been doing well.  She has been in physical therapy for which she feels is helping.  Review of Systems as detailed in HPI.  All others reviewed and are negative.   Objective: Vital Signs: There were no vitals taken for this visit.  Physical Exam well-developed well-nourished female no acute distress.  Alert and oriented x3.  Ortho Exam left knee exam shows no effusion.  Range of motion 0 to 125 degrees.  5 out of 5 strength.  She is neurovascular intact distally.  Specialty Comments:  No specialty comments available.  Imaging: No new imaging   PMFS History: Patient Active Problem List   Diagnosis Date Noted  . New ACL tear, left, initial encounter 01/03/2021   No past medical history on file.  No family history on file.  No past surgical history on file. Social History    Occupational History  . Not on file  Tobacco Use  . Smoking status: Never Smoker  . Smokeless tobacco: Never Used  Substance and Sexual Activity  . Alcohol use: Yes    Comment: 2 to 3 drinks on the weekend.   . Drug use: Not Currently  . Sexual activity: Not on file

## 2021-02-17 ENCOUNTER — Encounter: Payer: Self-pay | Admitting: Physical Therapy

## 2021-02-17 ENCOUNTER — Other Ambulatory Visit: Payer: Self-pay

## 2021-02-17 ENCOUNTER — Ambulatory Visit (INDEPENDENT_AMBULATORY_CARE_PROVIDER_SITE_OTHER): Payer: Commercial Managed Care - PPO | Admitting: Physical Therapy

## 2021-02-17 DIAGNOSIS — M6281 Muscle weakness (generalized): Secondary | ICD-10-CM

## 2021-02-17 DIAGNOSIS — M25562 Pain in left knee: Secondary | ICD-10-CM | POA: Diagnosis not present

## 2021-02-17 NOTE — Therapy (Signed)
[facility] Physical Therapy [address] [place], [place], [address] Phone: [phone]   Fax:  [phone]  Physical Therapy Treatment/Discharge Summary  Patient Details  Name: Brenda Diaz MRN: [id number] Date of Birth: 06/12/1996 Referring Provider (PT): Dr. Erlinda Hong   Encounter Date: 02/17/2021   PT End of Session - 02/17/21 0833    Visit Number 3    Number of Visits 6    Date for PT Re-Evaluation 03/08/21    Authorization Type [employer] UMR $50 copay    PT Start Time 0802    PT Stop Time 0833    PT Time Calculation (min) 31 min    Activity Tolerance Patient tolerated treatment well    Behavior During Therapy [facility] for tasks assessed/performed           History reviewed. No pertinent past medical history.  History reviewed. No pertinent surgical history.  There were no vitals filed for this visit.   Subjective Assessment - 02/17/21 0804    Subjective new exercises are going well, knee is feeling much better    Pertinent History depression    Limitations Standing    Diagnostic tests MRI: partial ACL tear    Patient Stated Goals improve pain, stability, return to running/squats    Currently in Pain? No/denies              [facility] PT Assessment - 02/17/21 0814      Assessment   Medical Diagnosis Partial Lt ACL tear    Referring Provider (PT) Dr. Erlinda Hong    Onset Date/Surgical Date 09/23/20      Observation/Other Assessments   Focus on Therapeutic Outcomes (FOTO)  67      Strength   Left Hip ADduction 5/5    Left Knee Flexion 5/5    Left Knee Extension 5/5                         [facility] Adult PT Treatment/Exercise - 02/17/21 0818      Knee/Hip Exercises: Aerobic   Recumbent Bike L5 x 8 min      Knee/Hip Exercises: Machines for Strengthening   Cybex Leg Press LLE only 3x10, 75#      Knee/Hip Exercises: Standing   Lunge Walking - Round Trips 20'x 2 with 6# DB with trunk rotation    Other Standing Knee Exercises curtsy lunge x 10  bil                  PT Education - 02/17/21 0833    Education Details added to HEP    Person(s) Educated Patient    Methods Explanation;Demonstration;Handout    Comprehension Verbalized understanding;Returned demonstration            PT Short Term Goals - 02/17/21 0834      PT SHORT TERM GOAL #1   Title report pain <2/10 with activity for improved function    Status Achieved    Target Date 02/08/21             PT Long Term Goals - 02/17/21 0834      PT LONG TERM GOAL #1   Title independent with HEP    Status Achieved      PT LONG TERM GOAL #2   Title demonstrate 5/5 LLE strength for improved function    Status Achieved      PT LONG TERM GOAL #3   Title negotiate stairs without increase in pain for improved function  Status Achieved                 Plan - 02/17/21 0834    Clinical Impression Statement Pt has met all goals and feels confident to transition to gym program at this time.  Recommended she return when able to begin running if needed.    Personal Factors and Comorbidities Comorbidity 1    Comorbidities depression    Examination-Activity Limitations Squat;Stairs;Stand;Locomotion Level    Examination-Participation Restrictions Community Activity;Occupation    Stability/Clinical Decision Making Stable/Uncomplicated    Rehab Potential Excellent    PT Frequency 1x / week   up to 1x/wk   PT Duration 8 weeks    PT Treatment/Interventions ADLs/Self Care Home Management;Cryotherapy;Electrical Stimulation;Moist Heat;Balance training;Therapeutic exercise;Therapeutic activities;Functional mobility training;Gait training;Stair training;Neuromuscular re-education;Patient/family education;Manual techniques;Vasopneumatic Device;Taping;Dry needling    PT Next Visit Plan d/c PT today    PT Home Exercise Plan Access Code: [email or web address]    Consulted and Agree with Plan of Care Patient           Patient will benefit from skilled therapeutic intervention  in order to improve the following deficits and impairments:  Pain,Decreased strength,Decreased mobility,Decreased activity tolerance  Visit Diagnosis: Acute pain of left knee  Muscle weakness (generalized)     Problem List Patient Active Problem List   Diagnosis Date Noted  . New ACL tear, left, initial encounter 01/03/2021      Laureen Abrahams, PT, DPT 02/17/21 8:36 AM    [facility] Physical Therapy [address] [place], [place], [address] Phone: [phone]   Fax:  [phone]  Name: Brenda Diaz MRN: [id number] Date of Birth: 06/20/1996     PHYSICAL THERAPY DISCHARGE SUMMARY  Visits from Start of Care: 3  Current functional level related to goals / functional outcomes: See above   Remaining deficits: n/a   Education / Equipment: HEP  Plan: Patient agrees to discharge.  Patient goals were met. Patient is being discharged due to meeting the stated rehab goals.  ?????     Laureen Abrahams, PT, DPT 02/17/21 8:36 AM  [facility] Physical Therapy [address] [place], [place], [address] Phone: [phone]   Fax:  [phone]

## 2021-02-17 NOTE — Patient Instructions (Signed)
Access Code: [email or web address] URL: [email or web address] Date: 02/17/2021 Prepared by: Moshe Cipro  Exercises Elliptical - 1 x daily - 3-4 x weekly - 1 sets - 1 reps Upright Bike - 1 x daily - 3-4 x weekly - 1 sets - 1 reps Single Leg Knee Extension with Weight Machine - 1 x daily - 3-4 x weekly - 3 sets - 10 reps Single Leg Hamstring Curl with Weight Machine - 1 x daily - 3-4 x weekly - 3 sets - 10 reps Single Leg Press - 1 x daily - 3-4 x weekly - 3 sets - 10 reps Sidelying Hip Adduction - 1 x daily - 7 x weekly - 3 sets - 10 reps Straight Leg Raise with External Rotation - 3 x daily - 7 x weekly - 1 sets - 10 reps - 2-3 sec hold Wall Squat with Swiss Ball - 1 x daily - 7 x weekly - 3 sets - 10 reps - 3-5 sec hold Single Leg Deadlift with Kettlebell - 1 x daily - 7 x weekly - 3 sets - 10 reps Lateral Lunge - 1 x daily - 7 x weekly - 3 sets - 10 reps Supine Hamstring Curl on Swiss Ball - 1 x daily - 7 x weekly - 3 sets - 10 reps Forward Lunge with Rotation - 1 x daily - 7 x weekly - 3 sets - 10 reps Curtsy Lunge with TRX - 1 x daily - 7 x weekly - 3 sets - 10 reps

## 2021-04-10 ENCOUNTER — Ambulatory Visit (INDEPENDENT_AMBULATORY_CARE_PROVIDER_SITE_OTHER): Payer: Commercial Managed Care - PPO | Admitting: Adult Health

## 2021-04-10 ENCOUNTER — Other Ambulatory Visit: Payer: Self-pay

## 2021-04-10 ENCOUNTER — Encounter: Payer: Self-pay | Admitting: Adult Health

## 2021-04-10 DIAGNOSIS — F331 Major depressive disorder, recurrent, moderate: Secondary | ICD-10-CM

## 2021-04-10 DIAGNOSIS — G4700 Insomnia, unspecified: Secondary | ICD-10-CM | POA: Diagnosis not present

## 2021-04-10 DIAGNOSIS — F411 Generalized anxiety disorder: Secondary | ICD-10-CM | POA: Diagnosis not present

## 2021-04-10 MED ORDER — LAMOTRIGINE 100 MG PO TABS: 100.0000 mg | ORAL_TABLET | Freq: Every day | ORAL | 1 refills | Status: DC

## 2021-04-10 MED ORDER — TRINTELLIX 20 MG PO TABS: 20.0000 mg | ORAL_TABLET | Freq: Every day | ORAL | 1 refills | Status: DC

## 2021-04-10 MED ORDER — TRAZODONE HCL 50 MG PO TABS: 50.0000 mg | ORAL_TABLET | Freq: Every day | ORAL | 1 refills | Status: DC

## 2021-04-10 NOTE — Progress Notes (Signed)
Saprina Chuong [id number] [birth year] 25 y.o.  Subjective:   Patient ID:  Brenda Diaz is a 25 y.o. (DOB [birth year]) female.  Chief Complaint: No chief complaint on file.   HPI Jorgia Manthei presents to the office today for follow-up of MDD, GAD, and insomnia.  Describes mood today as "ok". Pleasant. Mood symptoms - reports some depression - "usually happens around spring". Denies anxiety and irritability. Stating "I get kinda down in the spring ". Mood level. Trying to get out and do things. Upcoming trip to [place], [place] and [place]. Stable interest and motivation. Taking medications as prescribed.  Energy levels stable. Active, does not have a regular exercise routine with a torn ACL.  Enjoys some usual interests and activities. Lives alone. No pets. Family live in [place]. Spending time with friends. Talking with family. Appetite adequate. Eating healthy. Weight gain - 150 pounds. Sleeps well most nights. Averages 9 hours. Focus and concentration stable. Completing tasks. Managing aspects of household. Works full-time at [employer]  Denies SI or HI.  Denies AH or VH.  Previous medication trials: Zoloft   Review of Systems:  Review of Systems  Musculoskeletal: Negative for gait problem.  Neurological: Negative for tremors.  Psychiatric/Behavioral:       Please refer to HPI    Medications: I have reviewed the patient's current medications.  Current Outpatient Medications  Medication Sig Dispense Refill  . lamoTRIgine (LAMICTAL) 100 MG tablet Take 1 tablet (100 mg total) by mouth daily. 90 tablet 1  . traZODone (DESYREL) 50 MG tablet Take 1 tablet (50 mg total) by mouth at bedtime. 90 tablet 1  . TRINTELLIX 20 MG TABS tablet Take 1 tablet (20 mg total) by mouth daily. 90 tablet 1   No current facility-administered medications for this visit.    Medication Side Effects: None  Allergies: No Known Allergies  No past medical history on file.  Past Medical History,  Surgical history, Social history, and Family history were reviewed and updated as appropriate.   Please see review of systems for further details on the patient's review from today.   Objective:   Physical Exam:  There were no vitals taken for this visit.  Physical Exam Constitutional:      General: She is not in acute distress. Musculoskeletal:        General: No deformity.  Neurological:     Mental Status: She is alert and oriented to person, place, and time.     Coordination: Coordination normal.  Psychiatric:        Attention and Perception: Attention and perception normal. She does not perceive auditory or visual hallucinations.        Mood and Affect: Mood normal. Mood is not anxious or depressed. Affect is not labile, blunt, angry or inappropriate.        Speech: Speech normal.        Behavior: Behavior normal.        Thought Content: Thought content normal. Thought content is not paranoid or delusional. Thought content does not include homicidal or suicidal ideation. Thought content does not include homicidal or suicidal plan.        Cognition and Memory: Cognition and memory normal.        Judgment: Judgment normal.     Comments: Insight intact     Lab Review:  No results found for: NA, K, CL, CO2, GLUCOSE, BUN, CREATININE, CALCIUM, PROT, ALBUMIN, AST, ALT, ALKPHOS, BILITOT, GFRNONAA, GFRAA  No results found for: WBC, RBC, HGB, HCT,  PLT, MCV, MCH, MCHC, RDW, LYMPHSABS, MONOABS, EOSABS, BASOSABS  No results found for: POCLITH, LITHIUM   No results found for: PHENYTOIN, PHENOBARB, VALPROATE, CBMZ   .res Assessment: Plan:    Plan:  PDMP reviewed  1. Trintellix 20mg  daily 2. Trazadone 50mg  at bedtime 3. Lamictal 100mg  at hs   Read and reviewed note with patient for accuracy.   RTC 5 months  Patient advised to contact office with any questions, adverse effects, or acute worsening in signs and symptoms. patient regarding potential benefits, risks, and side  effects of Lamictal to include potential risk of Stevens-Johnson syndrome. Advised patient to stop taking Lamictal and contact office immediately if rash develops and to seek urgent medical attention if rash is severe and/or spreading quickly.   Diagnoses and all orders for this visit:  Major depressive disorder, recurrent episode, moderate (HCC) -     lamoTRIgine (LAMICTAL) 100 MG tablet; Take 1 tablet (100 mg total) by mouth daily. -     TRINTELLIX 20 MG TABS tablet; Take 1 tablet (20 mg total) by mouth daily.  Generalized anxiety disorder -     TRINTELLIX 20 MG TABS tablet; Take 1 tablet (20 mg total) by mouth daily.  Insomnia, unspecified type -     traZODone (DESYREL) 50 MG tablet; Take 1 tablet (50 mg total) by mouth at bedtime.     Please see After Visit Summary for patient specific instructions.  Future Appointments  Date Time Provider Department Center  04/14/2021  8:15 AM , MD [facility] None    No orders of the defined types were placed in this encounter.   -------------------------------

## 2021-04-14 ENCOUNTER — Encounter: Payer: Self-pay | Admitting: Orthopaedic Surgery

## 2021-04-14 ENCOUNTER — Ambulatory Visit: Payer: Commercial Managed Care - PPO | Admitting: Orthopaedic Surgery

## 2021-04-14 DIAGNOSIS — S83512A Sprain of anterior cruciate ligament of left knee, initial encounter: Secondary | ICD-10-CM | POA: Diagnosis not present

## 2021-04-14 NOTE — Progress Notes (Signed)
   Office Visit Note   Patient: Brenda Diaz           Date of Birth: [birth year]           MRN: [id number] Visit Date: 04/14/2021              Requested by: No referring provider defined for this encounter. PCP: Patient, No Pcp Per (Inactive)   Assessment & Plan: Visit Diagnoses:  1. New ACL tear, left, initial encounter     Plan: At this point I recommend that she work specifically on hamstring strengthening to prevent future ACL injuries.  She will increase activity as tolerated.  Follow-up as needed.  Follow-Up Instructions: Return if symptoms worsen or fail to improve.   Orders:  No orders of the defined types were placed in this encounter.  No orders of the defined types were placed in this encounter.     Procedures: No procedures performed   Clinical Data: No additional findings.   Subjective: Chief Complaint  Patient presents with  . Left Knee - Pain    Vernona Rieger returns today for follow-up status post partial left ACL tear.  She has completed physical therapy and doing a lot better.  She has some pain at times.  She works for [employer] in [employer].   Review of Systems   Objective: Vital Signs: There were no vitals taken for this visit.  Physical Exam  Ortho Exam Left knee shows full range of motion without pain.  Her ACL feels stable to stress. Specialty Comments:  No specialty comments available.  Imaging: No results found.   PMFS History: Patient Active Problem List   Diagnosis Date Noted  . New ACL tear, left, initial encounter 01/03/2021   History reviewed. No pertinent past medical history.  History reviewed. No pertinent family history.  History reviewed. No pertinent surgical history. Social History   Occupational History  . Not on file  Tobacco Use  . Smoking status: Never Smoker  . Smokeless tobacco: Never Used  Substance and Sexual Activity  . Alcohol use: Yes    Comment: 2 to 3 drinks on the weekend.   . Drug use: Not  Currently  . Sexual activity: Not on file

## 2021-09-11 ENCOUNTER — Other Ambulatory Visit: Payer: Self-pay

## 2021-09-11 ENCOUNTER — Ambulatory Visit (INDEPENDENT_AMBULATORY_CARE_PROVIDER_SITE_OTHER): Payer: Commercial Managed Care - PPO | Admitting: Adult Health

## 2021-09-11 ENCOUNTER — Encounter: Payer: Self-pay | Admitting: Adult Health

## 2021-09-11 DIAGNOSIS — G4700 Insomnia, unspecified: Secondary | ICD-10-CM

## 2021-09-11 DIAGNOSIS — F411 Generalized anxiety disorder: Secondary | ICD-10-CM

## 2021-09-11 DIAGNOSIS — F331 Major depressive disorder, recurrent, moderate: Secondary | ICD-10-CM | POA: Diagnosis not present

## 2021-09-11 MED ORDER — TRAZODONE HCL 50 MG PO TABS: 50.0000 mg | ORAL_TABLET | Freq: Every day | ORAL | 1 refills | Status: DC

## 2021-09-11 MED ORDER — LAMOTRIGINE 100 MG PO TABS
100.0000 mg | ORAL_TABLET | Freq: Every day | ORAL | 1 refills | Status: DC
Start: 2021-09-11 — End: 2022-05-30

## 2021-09-11 MED ORDER — TRINTELLIX 20 MG PO TABS: 20.0000 mg | ORAL_TABLET | Freq: Every day | ORAL | 1 refills | Status: DC

## 2021-09-11 NOTE — Progress Notes (Signed)
Brenda Diaz [id number] 1996-05-25 25 y.o.  Subjective:   Patient ID:  Brenda Diaz is a 25 y.o. (DOB 11-Feb-1996) female.  Chief Complaint: No chief complaint on file.   HPI Brenda Diaz presents to the office today for follow-up of MDD, GAD, and insomnia.  Describes mood today as "ok". Pleasant. Mood symptoms - denies depression, anxiety and irritability. Stating "I feel pretty good". Mood remains level. Has moved to [facility]. Upcoming trip to [place]. Stable interest and motivation. Taking medications as prescribed.  Energy levels stable. Active, has regular exercise routine - 3 days a week. Enjoys some usual interests and activities. Lives alone. No pets. Family live in [place]. Spending time with friends. Talking with family. Appetite adequate. Eating healthy. Weight gain - 150 pounds. Sleeps well most nights. Averages 9 hours. Focus and concentration stable. Completing tasks. Managing aspects of household. Works full-time at [employer]  Denies SI or HI.  Denies AH or VH.  Previous medication trials: Zoloft  Review of Systems:  Review of Systems  Musculoskeletal:  Negative for gait problem.  Neurological:  Negative for tremors.  Psychiatric/Behavioral:         Please refer to HPI   Medications: I have reviewed the patient's current medications.  Current Outpatient Medications  Medication Sig Dispense Refill   lamoTRIgine (LAMICTAL) 100 MG tablet Take 1 tablet (100 mg total) by mouth daily. 90 tablet 1   traZODone (DESYREL) 50 MG tablet Take 1 tablet (50 mg total) by mouth at bedtime. 90 tablet 1   TRINTELLIX 20 MG TABS tablet Take 1 tablet (20 mg total) by mouth daily. 90 tablet 1   No current facility-administered medications for this visit.    Medication Side Effects: None  Allergies: No Known Allergies  No past medical history on file.  Past Medical History, Surgical history, Social history, and Family history were reviewed and updated as appropriate.   Please  see review of systems for further details on the patient's review from today.   Objective:   Physical Exam:  There were no vitals taken for this visit.  Physical Exam Constitutional:      General: She is not in acute distress. Musculoskeletal:        General: No deformity.  Neurological:     Mental Status: She is alert and oriented to person, place, and time.     Coordination: Coordination normal.  Psychiatric:        Attention and Perception: Attention and perception normal. She does not perceive auditory or visual hallucinations.        Mood and Affect: Mood normal. Mood is not anxious or depressed. Affect is not labile, blunt, angry or inappropriate.        Speech: Speech normal.        Behavior: Behavior normal.        Thought Content: Thought content normal. Thought content is not paranoid or delusional. Thought content does not include homicidal or suicidal ideation. Thought content does not include homicidal or suicidal plan.        Cognition and Memory: Cognition and memory normal.        Judgment: Judgment normal.     Comments: Insight intact    Lab Review:  No results found for: NA, K, CL, CO2, GLUCOSE, BUN, CREATININE, CALCIUM, PROT, ALBUMIN, AST, ALT, ALKPHOS, BILITOT, GFRNONAA, GFRAA  No results found for: WBC, RBC, HGB, HCT, PLT, MCV, MCH, MCHC, RDW, LYMPHSABS, MONOABS, EOSABS, BASOSABS  No results found for: POCLITH, LITHIUM  No results found for: PHENYTOIN, PHENOBARB, VALPROATE, CBMZ   .res Assessment: Plan:    Plan:  PDMP reviewed  1. Trintellix 20mg  daily 2. Trazadone 50mg  at bedtime 3. Lamictal 100mg  at hs   Read and reviewed note with patient for accuracy.   RTC 6 months  Patient advised to contact office with any questions, adverse effects, or acute worsening in signs and symptoms. patient regarding potential benefits, risks, and side effects of Lamictal to include potential risk of Stevens-Johnson syndrome. Advised patient to stop taking  Lamictal and contact office immediately if rash develops and to seek urgent medical attention if rash is severe and/or spreading quickly.  Diagnoses and all orders for this visit:  Insomnia, unspecified type  Major depressive disorder, recurrent episode, moderate (HCC)  Generalized anxiety disorder    Please see After Visit Summary for patient specific instructions.  No future appointments.  No orders of the defined types were placed in this encounter.   -------------------------------

## 2022-03-12 ENCOUNTER — Ambulatory Visit (INDEPENDENT_AMBULATORY_CARE_PROVIDER_SITE_OTHER): Payer: Commercial Managed Care - PPO | Admitting: Adult Health

## 2022-03-12 ENCOUNTER — Encounter: Payer: Self-pay | Admitting: Adult Health

## 2022-03-12 DIAGNOSIS — G4700 Insomnia, unspecified: Secondary | ICD-10-CM | POA: Diagnosis not present

## 2022-03-12 DIAGNOSIS — F411 Generalized anxiety disorder: Secondary | ICD-10-CM | POA: Diagnosis not present

## 2022-03-12 DIAGNOSIS — F331 Major depressive disorder, recurrent, moderate: Secondary | ICD-10-CM

## 2022-03-12 MED ORDER — CARIPRAZINE HCL 1.5 MG PO CAPS: 1.5000 mg | ORAL_CAPSULE | Freq: Every day | ORAL | 2 refills | Status: DC

## 2022-03-12 MED ORDER — TRINTELLIX 20 MG PO TABS: 20.0000 mg | ORAL_TABLET | Freq: Every day | ORAL | 1 refills | Status: DC

## 2022-03-12 MED ORDER — TRAZODONE HCL 50 MG PO TABS: 50.0000 mg | ORAL_TABLET | Freq: Every day | ORAL | 1 refills | Status: DC

## 2022-03-12 NOTE — Progress Notes (Addendum)
Brenda Diaz ?[id number] ?05-16-1996 ?26 y.o. ? ?Subjective:  ? ?Patient ID:  Brenda Diaz is a 26 y.o. (DOB 1996/07/13) female. ? ?Chief Complaint: No chief complaint on file. ? ? ?HPI ?Brenda Diaz presents to the office today for follow-up of MDD, GAD, and insomnia. ? ?Describes mood today as "ok". Pleasant. Mood symptoms - denies depression and irritability. Feels anxious all the time. Mood is consistent. Stating "I'm doing good". Wanting to consider a different mood stabilizer other than Lamictal due to contraceptive issues. Is willing to consider Vraylar as an option. Working in [place]. Living in [place]. Stable interest and motivation. Taking medications as prescribed.  ?Energy levels stable. Active, has regular exercise routine - 3 days a week. ?Enjoys some usual interests and activities. Lives alone. No pets. Family live in [place]. Spending time with friends. Talking with family. ?Appetite adequate. Eating healthy. Weight gain - 150 pounds. ?Sleeps well most nights. Averages 9 hours. ?Focus and concentration stable. Completing tasks. Managing aspects of household. Works full-time at [employer] ?Denies SI or HI.  ?Denies AH or VH. ? ?Previous medication trials: Zoloft ?  ?Review of Systems:  ?Review of Systems  ?Musculoskeletal:  Negative for gait problem.  ?Neurological:  Negative for tremors.  ?Psychiatric/Behavioral:    ?     Please refer to HPI  ? ?Medications: I have reviewed the patient's current medications. ? ?Current Outpatient Medications  ?Medication Sig Dispense Refill  ? cariprazine (VRAYLAR) 1.5 MG capsule Take 1 capsule (1.5 mg total) by mouth daily. 30 capsule 2  ? lamoTRIgine (LAMICTAL) 100 MG tablet Take 1 tablet (100 mg total) by mouth daily. 90 tablet 1  ? traZODone (DESYREL) 50 MG tablet Take 1 tablet (50 mg total) by mouth at bedtime. 90 tablet 1  ? TRINTELLIX 20 MG TABS tablet Take 1 tablet (20 mg total) by mouth daily. 90 tablet 1  ? ?No current facility-administered medications for  this visit.  ? ? ?Medication Side Effects: None ? ?Allergies: No Known Allergies ? ?No past medical history on file. ? ?Past Medical History, Surgical history, Social history, and Family history were reviewed and updated as appropriate.  ? ?Please see review of systems for further details on the patient's review from today.  ? ?Objective:  ? ?Physical Exam:  ?There were no vitals taken for this visit. ? ?Physical Exam ?Constitutional:   ?   General: She is not in acute distress. ?Musculoskeletal:     ?   General: No deformity.  ?Neurological:  ?   Mental Status: She is alert and oriented to person, place, and time.  ?   Coordination: Coordination normal.  ?Psychiatric:     ?   Attention and Perception: Attention and perception normal. She does not perceive auditory or visual hallucinations.     ?   Mood and Affect: Mood normal. Mood is not anxious or depressed. Affect is not labile, blunt, angry or inappropriate.     ?   Speech: Speech normal.     ?   Behavior: Behavior normal.     ?   Thought Content: Thought content normal. Thought content is not paranoid or delusional. Thought content does not include homicidal or suicidal ideation. Thought content does not include homicidal or suicidal plan.     ?   Cognition and Memory: Cognition and memory normal.     ?   Judgment: Judgment normal.  ?   Comments: Insight intact  ? ? ?Lab Review:  ?No results found for: NA, K,  CL, CO2, GLUCOSE, BUN, CREATININE, CALCIUM, PROT, ALBUMIN, AST, ALT, ALKPHOS, BILITOT, GFRNONAA, GFRAA ? ?No results found for: WBC, RBC, HGB, HCT, PLT, MCV, MCH, MCHC, RDW, LYMPHSABS, MONOABS, EOSABS, BASOSABS ? ?No results found for: POCLITH, LITHIUM  ? ?No results found for: PHENYTOIN, PHENOBARB, VALPROATE, CBMZ  ? ?.res ?Assessment: Plan:   ? ?Plan: ? ?PDMP reviewed ? ?1. Trintellix 20mg  daily ?2. Trazadone 50mg  at bedtime ?3. D/C Lamictal 100mg  at hs - take 1/2 tablet daily for 7 days, then d/c. ?4. Add Vraylar - failed Abilify, Depakote, others -  mother has a list. Gave patient a 7 day supply of the 1.5mg  daily and copay card. ? ?Read and reviewed note with patient for accuracy.  ? ?RTC 8 weeks ? ?Patient advised to contact office with any questions, adverse effects, or acute worsening in signs and symptoms. ? ? ?Time spent with patient was 25 minutes. Greater than 50% of face to face time with patient was spent on counseling and coordination of care.  ? ?Diagnoses and all orders for this visit: ? ?Major depressive disorder, recurrent episode, moderate (HCC) ?-     cariprazine (VRAYLAR) 1.5 MG capsule; Take 1 capsule (1.5 mg total) by mouth daily. ?-     TRINTELLIX 20 MG TABS tablet; Take 1 tablet (20 mg total) by mouth daily. ? ?Insomnia, unspecified type ?-     traZODone (DESYREL) 50 MG tablet; Take 1 tablet (50 mg total) by mouth at bedtime. ? ?Generalized anxiety disorder ?-     TRINTELLIX 20 MG TABS tablet; Take 1 tablet (20 mg total) by mouth daily. ? ?  ? ?Please see After Visit Summary for patient specific instructions. ? ?Future Appointments  ?Date Time Provider [place]  ?05/09/2022  2:00 PM Angelina Venard, Berdie Ogren, NP [facility] None  ? ? ?No orders of the defined types were placed in this encounter. ? ? ?------------------------------- ?

## 2022-04-25 ENCOUNTER — Other Ambulatory Visit: Payer: Self-pay | Admitting: Adult Health

## 2022-04-25 DIAGNOSIS — F331 Major depressive disorder, recurrent, moderate: Secondary | ICD-10-CM

## 2022-05-09 ENCOUNTER — Ambulatory Visit: Payer: Commercial Managed Care - PPO | Admitting: Adult Health

## 2022-05-14 ENCOUNTER — Ambulatory Visit: Payer: Commercial Managed Care - PPO | Admitting: Adult Health

## 2022-05-30 ENCOUNTER — Ambulatory Visit: Payer: Commercial Managed Care - PPO | Admitting: Adult Health

## 2022-05-30 ENCOUNTER — Encounter: Payer: Self-pay | Admitting: Adult Health

## 2022-05-30 DIAGNOSIS — F411 Generalized anxiety disorder: Secondary | ICD-10-CM | POA: Diagnosis not present

## 2022-05-30 DIAGNOSIS — F331 Major depressive disorder, recurrent, moderate: Secondary | ICD-10-CM | POA: Diagnosis not present

## 2022-05-30 DIAGNOSIS — G4700 Insomnia, unspecified: Secondary | ICD-10-CM | POA: Diagnosis not present

## 2022-05-30 DIAGNOSIS — F908 Attention-deficit hyperactivity disorder, other type: Secondary | ICD-10-CM

## 2022-05-30 MED ORDER — METHYLPHENIDATE HCL 10 MG PO TABS: 10.0000 mg | ORAL_TABLET | Freq: Every day | ORAL | 0 refills | Status: DC

## 2022-05-30 MED ORDER — CARIPRAZINE HCL 1.5 MG PO CAPS: 1.5000 mg | ORAL_CAPSULE | Freq: Every day | ORAL | 1 refills | Status: DC

## 2022-05-30 NOTE — Progress Notes (Signed)
Morgyn Marut [id number] 1996-04-21 26 y.o.  Subjective:   Patient ID:  Brenda Diaz is a 26 y.o. (DOB 07-11-1996) female.  Chief Complaint: No chief complaint on file.   HPI Brenda Diaz presents to the office today for follow-up of MDD, GAD, ADD and insomnia.  Describes mood today as "ok". Pleasant. Mood symptoms - denies anxiety, depression and irritability. Mood is consistent. Stating "I'm doing good". Feels like the addition of Vraylar has been helpful. Has been able to push through the day and get things done. Feels like she is having some issues with focus and concentration. Has taken a low dose of Ritalin in the past and felt like it was helpful. Working in [place] - traveling. Living in [place]. Stable interest and motivation. Taking medications as prescribed.  Energy levels good. Active, has regular exercise routine - 3 days a week. Enjoys some usual interests and activities. Lives alone. No pets. Family live in [place]. Spending time with friends. Talking with family. Appetite adequate. Eating healthy. Weight gain - 150 pounds. Sleeps well most nights. Averages 7 hours - waking up earlier in the morning and can't get back to sleep. Focus and concentration mostly stable - some difficulties. Completing tasks. Managing aspects of household. Works full-time at [employer]. Denies SI or HI.  Denies AH or VH.  Previous medication trials: Zoloft      Review of Systems:  Review of Systems  Musculoskeletal:  Negative for gait problem.  Neurological:  Negative for tremors.  Psychiatric/Behavioral:         Please refer to HPI    Medications: I have reviewed the patient's current medications.  Current Outpatient Medications  Medication Sig Dispense Refill   cariprazine (VRAYLAR) 1.5 MG capsule Take 1 capsule (1.5 mg total) by mouth daily. 30 capsule 2   lamoTRIgine (LAMICTAL) 100 MG tablet Take 1 tablet (100 mg total) by mouth daily. 90 tablet 1   traZODone (DESYREL) 50 MG tablet  Take 1 tablet (50 mg total) by mouth at bedtime. 90 tablet 1   TRINTELLIX 20 MG TABS tablet Take 1 tablet (20 mg total) by mouth daily. 90 tablet 1   No current facility-administered medications for this visit.    Medication Side Effects: None  Allergies: No Known Allergies  No past medical history on file.  Past Medical History, Surgical history, Social history, and Family history were reviewed and updated as appropriate.   Please see review of systems for further details on the patient's review from today.   Objective:   Physical Exam:  There were no vitals taken for this visit.  Physical Exam Constitutional:      General: She is not in acute distress. Musculoskeletal:        General: No deformity.  Neurological:     Mental Status: She is alert and oriented to person, place, and time.     Coordination: Coordination normal.  Psychiatric:        Attention and Perception: Attention and perception normal. She does not perceive auditory or visual hallucinations.        Mood and Affect: Mood normal. Mood is not anxious or depressed. Affect is not labile, blunt, angry or inappropriate.        Speech: Speech normal.        Behavior: Behavior normal.        Thought Content: Thought content normal. Thought content is not paranoid or delusional. Thought content does not include homicidal or suicidal ideation. Thought content does not include homicidal  or suicidal plan.        Cognition and Memory: Cognition and memory normal.        Judgment: Judgment normal.     Comments: Insight intact     Lab Review:  No results found for: "NA", "K", "CL", "CO2", "GLUCOSE", "BUN", "CREATININE", "CALCIUM", "PROT", "ALBUMIN", "AST", "ALT", "ALKPHOS", "BILITOT", "GFRNONAA", "GFRAA"  No results found for: "WBC", "RBC", "HGB", "HCT", "PLT", "MCV", "MCH", "MCHC", "RDW", "LYMPHSABS", "MONOABS", "EOSABS", "BASOSABS"  No results found for: "POCLITH", "LITHIUM"   No results found for: "PHENYTOIN",  "PHENOBARB", "VALPROATE", "CBMZ"   .res Assessment: Plan:    Plan:  PDMP reviewed  1. Trintellix 20mg  daily 2. Trazadone 50mg  at bedtime 3. Vraylar 1.5mg  daily 4. Add Ritalin 10mg  daily  35/58 ADD -possible  Read and reviewed note with patient for accuracy.   RTC 8 weeks  Patient advised to contact office with any questions, adverse effects, or acute worsening in signs and symptoms.  Time spent with patient was 25 minutes. Greater than 50% of face to face time with patient was spent on counseling and coordination of care.   There are no diagnoses linked to this encounter.   Please see After Visit Summary for patient specific instructions.  No future appointments.  No orders of the defined types were placed in this encounter.   -------------------------------

## 2022-06-05 ENCOUNTER — Telehealth: Payer: Self-pay

## 2022-06-27 ENCOUNTER — Ambulatory Visit (INDEPENDENT_AMBULATORY_CARE_PROVIDER_SITE_OTHER): Payer: Commercial Managed Care - PPO | Admitting: Adult Health

## 2022-06-27 ENCOUNTER — Encounter: Payer: Self-pay | Admitting: Adult Health

## 2022-06-27 DIAGNOSIS — F331 Major depressive disorder, recurrent, moderate: Secondary | ICD-10-CM

## 2022-06-27 DIAGNOSIS — G4700 Insomnia, unspecified: Secondary | ICD-10-CM | POA: Diagnosis not present

## 2022-06-27 DIAGNOSIS — F411 Generalized anxiety disorder: Secondary | ICD-10-CM

## 2022-06-27 DIAGNOSIS — F908 Attention-deficit hyperactivity disorder, other type: Secondary | ICD-10-CM

## 2022-06-27 MED ORDER — METHYLPHENIDATE HCL 10 MG PO TABS: 10.0000 mg | ORAL_TABLET | Freq: Every day | ORAL | 0 refills | Status: DC

## 2022-06-27 NOTE — Progress Notes (Signed)
Brenda Diaz [id number] 1996-06-11 26 y.o.  Subjective:   Patient ID:  Brenda Diaz is a 26 y.o. (DOB 1996-04-03) female.  Chief Complaint: No chief complaint on file.   HPI Brenda Diaz presents to the office today for follow-up of MDD, GAD, ADD and insomnia.  Describes mood today as "ok". Pleasant. Mood symptoms - denies anxiety, depression and irritability. Mood is consistent. Stating "I'm doing alright". Started the Ritalin and feels it is working well for her - taking 5mg  twice daily. Feels like other medications are also working well. Working in [place] - traveling. Living in [place]. Stable interest and motivation. Taking medications as prescribed.  Energy levels good. Active, has regular exercise routine. Enjoys some usual interests and activities. Lives alone. No pets. Family live in [place]. Spending time with friends. Talking with family. Appetite adequate. Eating healthy. Weight gain - 150 pounds. Sleeps well most nights. Averages 8 to 9 hours. Focus and concentration much better with addition of Ritalin. Completing tasks. Managing aspects of household. Works full-time at [place]. Denies SI or HI.  Denies AH or VH.      Review of Systems:  Review of Systems  Musculoskeletal:  Negative for gait problem.  Neurological:  Negative for tremors.  Psychiatric/Behavioral:         Please refer to HPI    Medications: I have reviewed the patient's current medications.  Current Outpatient Medications  Medication Sig Dispense Refill   cariprazine (VRAYLAR) 1.5 MG capsule Take 1 capsule (1.5 mg total) by mouth daily. 90 capsule 1   methylphenidate (RITALIN) 10 MG tablet Take 1 tablet (10 mg total) by mouth daily. 90 tablet 0   traZODone (DESYREL) 50 MG tablet Take 1 tablet (50 mg total) by mouth at bedtime. 90 tablet 1   TRINTELLIX 20 MG TABS tablet Take 1 tablet (20 mg total) by mouth daily. 90 tablet 1   No current facility-administered medications for this visit.     Medication Side Effects: None  Allergies: No Known Allergies  No past medical history on file.  Past Medical History, Surgical history, Social history, and Family history were reviewed and updated as appropriate.   Please see review of systems for further details on the patient's review from today.   Objective:   Physical Exam:  There were no vitals taken for this visit.  Physical Exam Constitutional:      General: She is not in acute distress. Musculoskeletal:        General: No deformity.  Neurological:     Mental Status: She is alert and oriented to person, place, and time.     Coordination: Coordination normal.  Psychiatric:        Attention and Perception: Attention and perception normal. She does not perceive auditory or visual hallucinations.        Mood and Affect: Mood normal. Mood is not anxious or depressed. Affect is not labile, blunt, angry or inappropriate.        Speech: Speech normal.        Behavior: Behavior normal.        Thought Content: Thought content normal. Thought content is not paranoid or delusional. Thought content does not include homicidal or suicidal ideation. Thought content does not include homicidal or suicidal plan.        Cognition and Memory: Cognition and memory normal.        Judgment: Judgment normal.     Comments: Insight intact     Lab Review:  No results found  for: "NA", "K", "CL", "CO2", "GLUCOSE", "BUN", "CREATININE", "CALCIUM", "PROT", "ALBUMIN", "AST", "ALT", "ALKPHOS", "BILITOT", "GFRNONAA", "GFRAA"  No results found for: "WBC", "RBC", "HGB", "HCT", "PLT", "MCV", "MCH", "MCHC", "RDW", "LYMPHSABS", "MONOABS", "EOSABS", "BASOSABS"  No results found for: "POCLITH", "LITHIUM"   No results found for: "PHENYTOIN", "PHENOBARB", "VALPROATE", "CBMZ"   .res Assessment: Plan:    Plan:  PDMP reviewed  1. Trintellix 20mg  daily 2. Trazadone 50mg  at bedtime 3. Vraylar 1.5mg  daily 4. Ritalin 10mg  daily  114/80/86  RTC 3  months  Patient advised to contact office with any questions, adverse effects, or acute worsening in signs and symptoms.  Time spent with patient was 25 minutes. Greater than 50% of face to face time with patient was spent on counseling and coordination of care.   Diagnoses and all orders for this visit:  Major depressive disorder, recurrent episode, moderate (HCC)  Attention deficit hyperactivity disorder (ADHD), other type -     methylphenidate (RITALIN) 10 MG tablet; Take 1 tablet (10 mg total) by mouth daily.  Insomnia, unspecified type  Generalized anxiety disorder     Please see After Visit Summary for patient specific instructions.  Future Appointments  Date Time Provider Department Center  09/26/2022  3:00 PM Kendle Turbin, , NP [facility] None    No orders of the defined types were placed in this encounter.   -------------------------------

## 2022-06-29 ENCOUNTER — Other Ambulatory Visit: Payer: Self-pay | Admitting: Adult Health

## 2022-06-29 DIAGNOSIS — F331 Major depressive disorder, recurrent, moderate: Secondary | ICD-10-CM

## 2022-06-29 DIAGNOSIS — F411 Generalized anxiety disorder: Secondary | ICD-10-CM

## 2022-07-31 IMAGING — MR MR KNEE*L* W/O CM
4 of 6 series · 21 of 40 positions shown · non-contrast
Comparison: X-ray 11/22/2020

CLINICAL DATA: Medial and posterior left knee pain since Saturday October, 2020

EXAM:
MRI OF THE LEFT KNEE WITHOUT CONTRAST
TECHNIQUE: Multiplanar, multisequence MR imaging of the knee was performed. No
intravenous contrast was administered.

[Series 3: T2 fat-sat · axial · 4.0mm · 0.50mm/px · z∈[-99,-4]mm · 4 of 24 slices shown (1 of 2)]
[im 1/24]
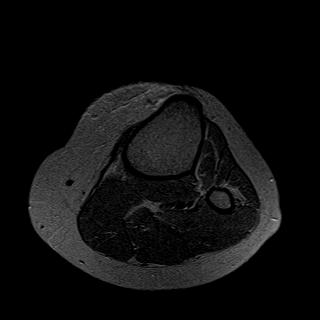
[im 4/24]
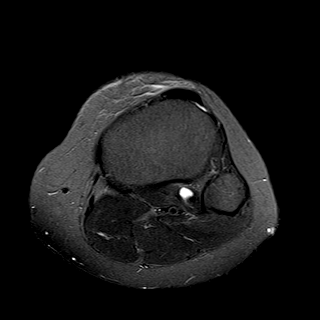
[im 12/24]
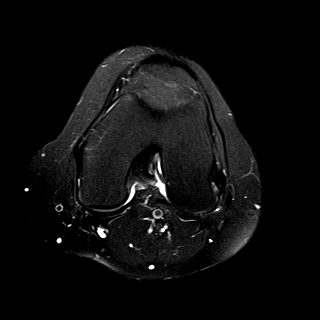
[im 20/24]
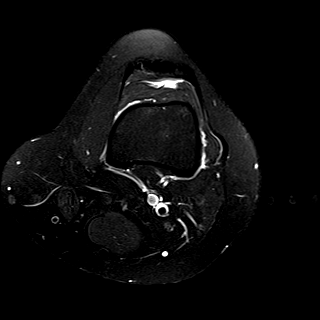

[Series 5: T2 fat-sat · coronal · 4.0mm · 0.29mm/px · 3 of 22 slices shown (2 of 2)]
[im 5/22]
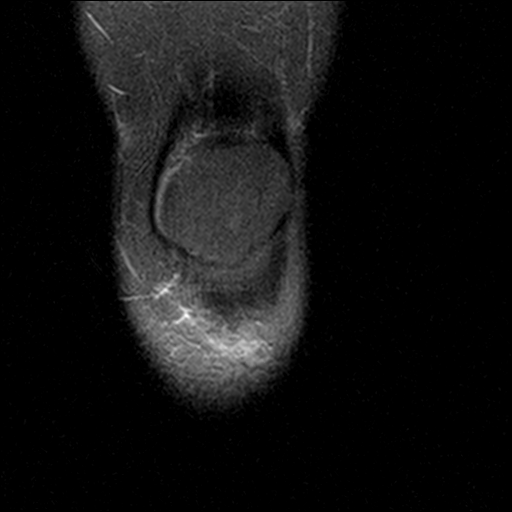
[im 13/22]
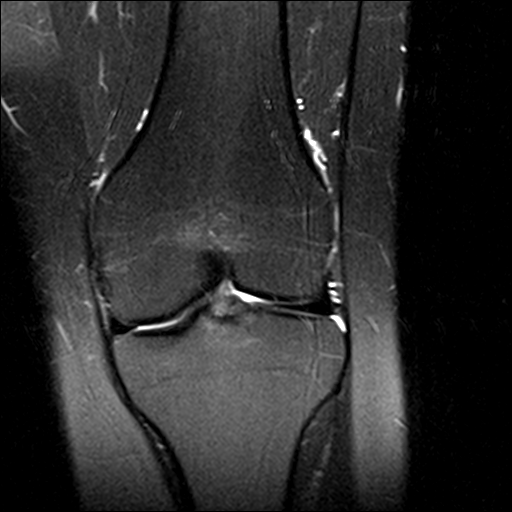
[im 22/22]
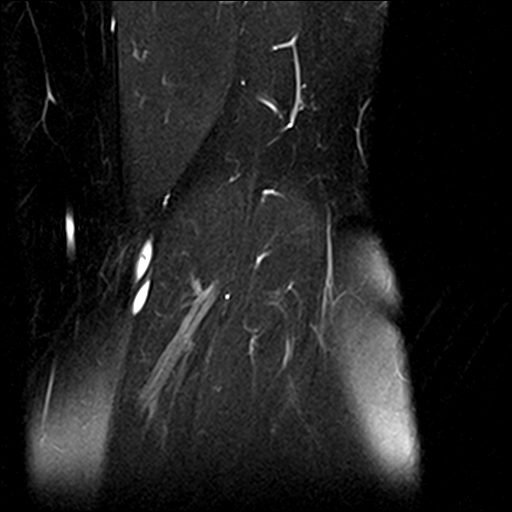

[Series 7: PD fat-sat · sagittal · 3.0mm · 0.29mm/px · 7 of 27 slices shown (1 of 2)]
[im 1/27]
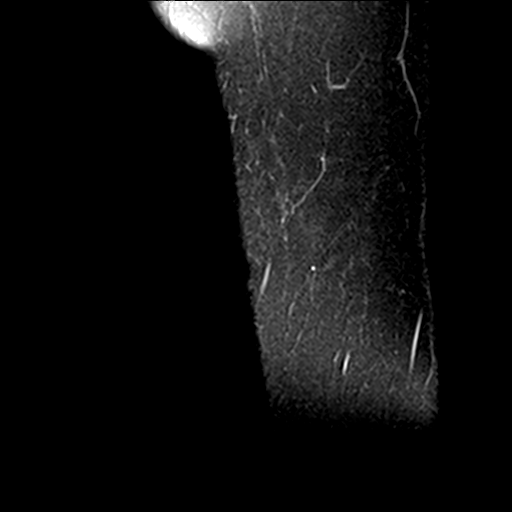
[im 5/27]
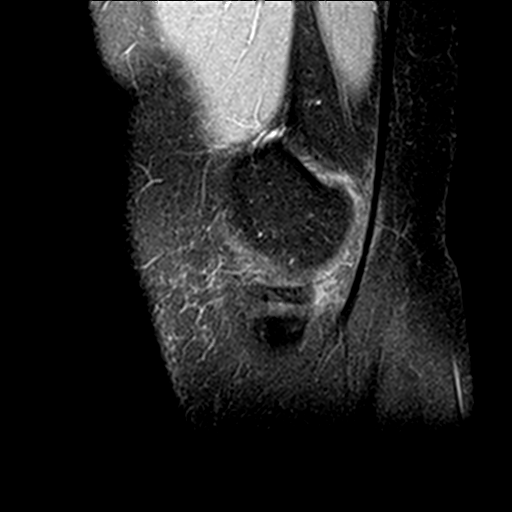
[im 9/27]
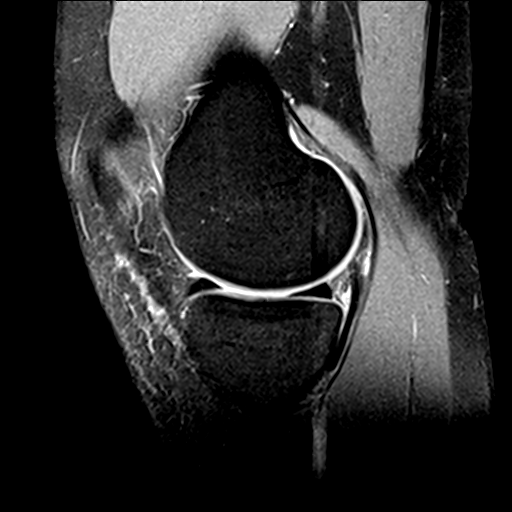
[im 14/27]
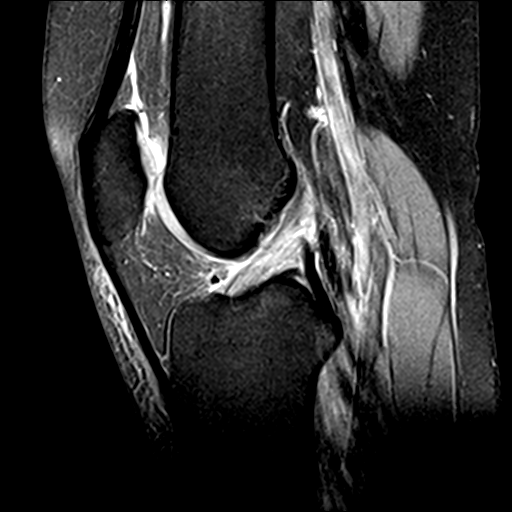
[im 18/27]
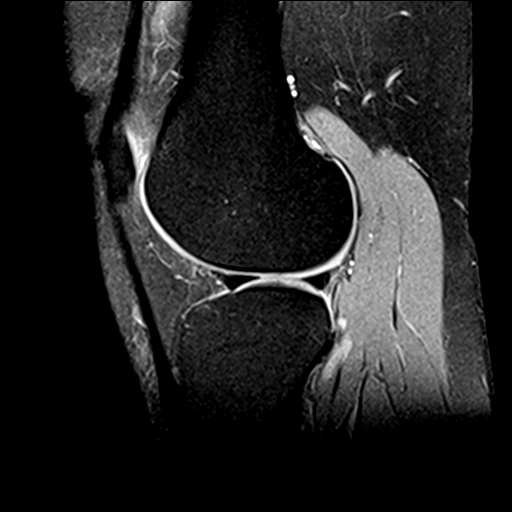
[im 22/27]
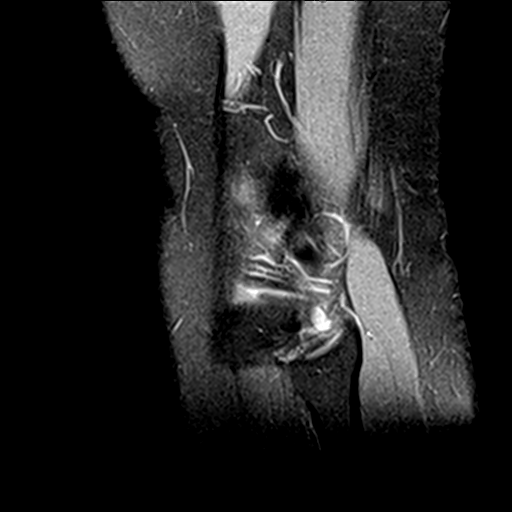
[im 27/27]
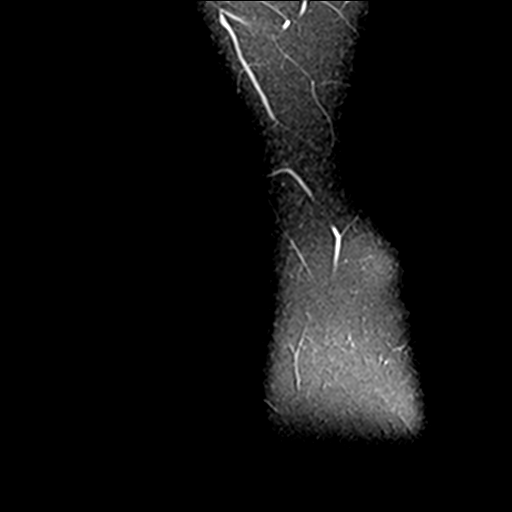

[Series 8: PD fat-sat · coronal · 3.0mm · 0.29mm/px · 7 of 28 slices shown (2 of 2)]
[im 1/28]
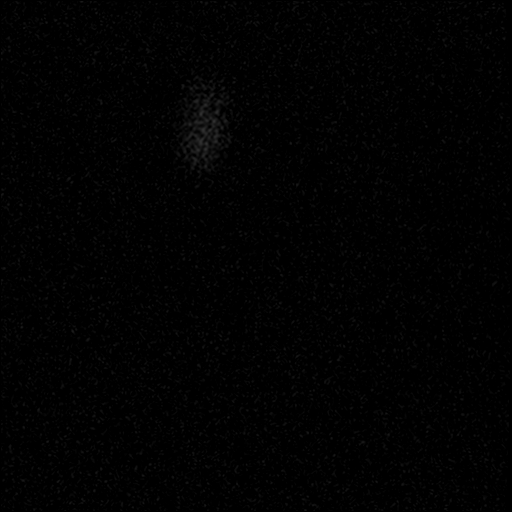
[im 5/28]
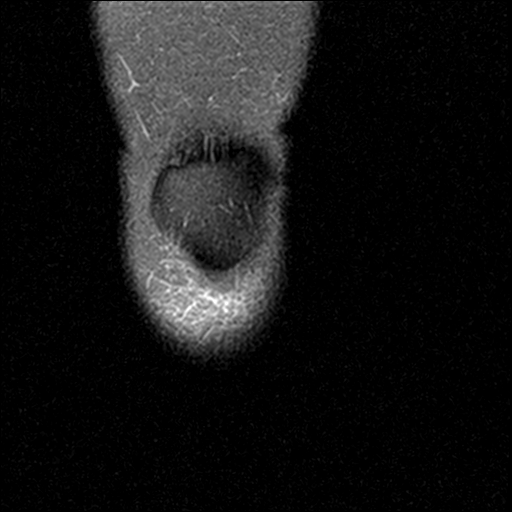
[im 10/28]
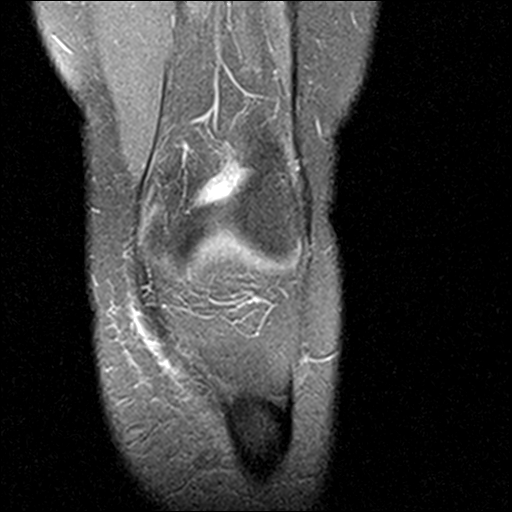
[im 14/28]
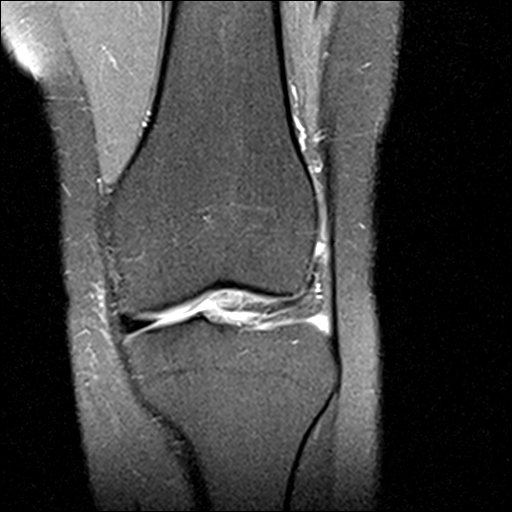
[im 19/28]
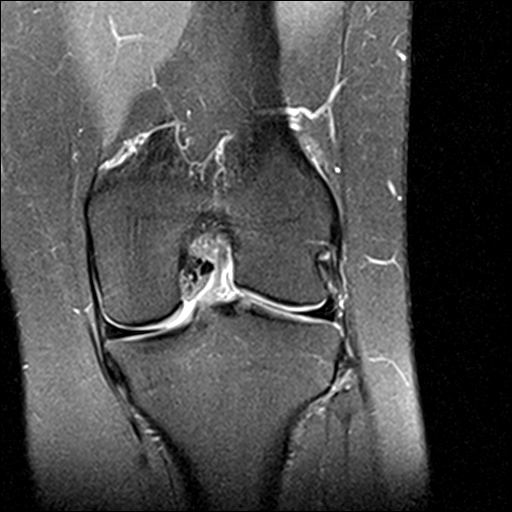
[im 23/28]
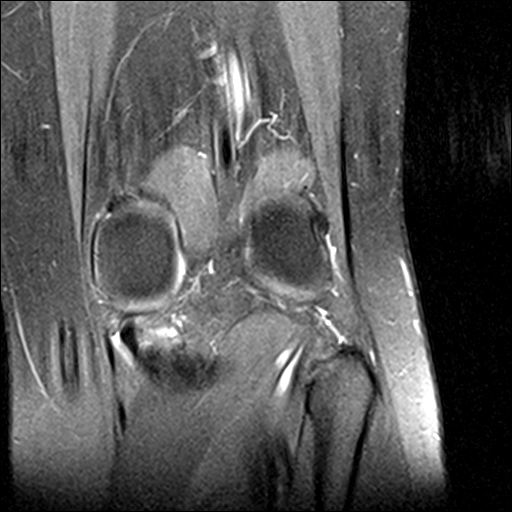
[im 28/28]
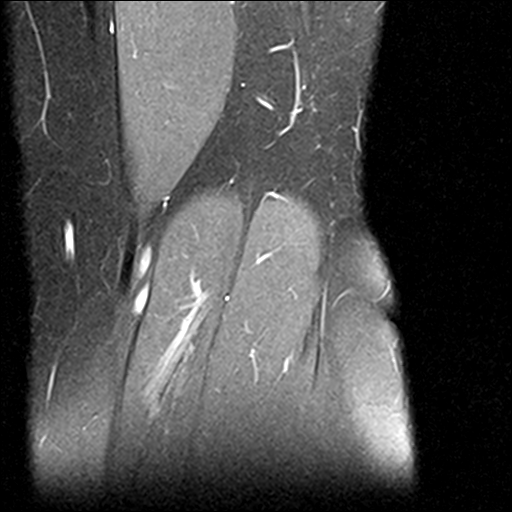

[21 of 40 positions shown; findings below may reference images not displayed]

FINDINGS: MENISCI

Medial meniscus:  Intact.

Lateral meniscus:  Intact.

LIGAMENTS

Cruciates: Increased T2 signal within the anterior cruciate ligament
with subtle partial-thickness tearing involving the posterolateral
bundle (series 8, image 10-12). No complete ACL tear/rupture. Mild
subcortical marrow edema underlying the ACL tibial attachment site.
Intact PCL.

Collaterals: Medial collateral ligament is intact. Lateral
collateral ligament complex is intact.

CARTILAGE

Patellofemoral:  No chondral defect.

Medial:  No chondral defect.

Lateral:  No chondral defect.

Joint: Trace joint fluid without significant effusion. Fat pads
within normal limits. Mild thickening of the medial plica.

Popliteal Fossa: Intact popliteus tendon. Small probable ganglion
cyst at the popliteus myotendinous junction measuring approximately
2.1 x 0.5 cm trace amount of fluid within a Baker's cyst. Trace
fluid within the semimembranosus-tibial collateral ligament bursa.

Extensor Mechanism:  Intact quadriceps tendon and patellar tendon.

Bones: Mild marrow edema at the tibial eminence underlying the ACL
attachment site. No pivot-shift bone contusion pattern. No fracture
or malalignment. No suspicious bone lesion.

Other: Mild prepatellar subcutaneous edema.
IMPRESSION: 1. Subacute appearing ACL sprain with subtle partial-thickness
tearing involving the posterolateral bundle. No complete ACL
tear/rupture. Mild reactive subcortical edema underlying the tibial
attachment site of the ACL.
2. Trace semimembranosus-tibial collateral ligament bursal fluid
which may reflect a mild bursitis.
3. Small probable ganglion cyst located at the popliteus
myotendinous junction measuring approximately 2.1 x 0.5 cm.
4. Intact menisci and collateral ligaments.

## 2022-09-25 ENCOUNTER — Other Ambulatory Visit: Payer: Self-pay | Admitting: Adult Health

## 2022-09-25 DIAGNOSIS — F331 Major depressive disorder, recurrent, moderate: Secondary | ICD-10-CM

## 2022-09-25 DIAGNOSIS — F411 Generalized anxiety disorder: Secondary | ICD-10-CM

## 2022-09-25 NOTE — Telephone Encounter (Signed)
Has appt with Gina tomorrow 

## 2022-09-26 ENCOUNTER — Encounter: Payer: Self-pay | Admitting: Adult Health

## 2022-09-26 ENCOUNTER — Ambulatory Visit: Payer: Commercial Managed Care - PPO | Admitting: Adult Health

## 2022-09-26 DIAGNOSIS — F331 Major depressive disorder, recurrent, moderate: Secondary | ICD-10-CM

## 2022-09-26 DIAGNOSIS — F908 Attention-deficit hyperactivity disorder, other type: Secondary | ICD-10-CM

## 2022-09-26 DIAGNOSIS — G4700 Insomnia, unspecified: Secondary | ICD-10-CM

## 2022-09-26 DIAGNOSIS — F411 Generalized anxiety disorder: Secondary | ICD-10-CM

## 2022-09-26 MED ORDER — METHYLPHENIDATE HCL 10 MG PO TABS: 10.0000 mg | ORAL_TABLET | Freq: Every day | ORAL | 0 refills | Status: DC

## 2022-09-26 MED ORDER — TRAZODONE HCL 50 MG PO TABS: 50.0000 mg | ORAL_TABLET | Freq: Every day | ORAL | 1 refills | Status: DC

## 2022-09-26 MED ORDER — TRINTELLIX 20 MG PO TABS: 20.0000 mg | ORAL_TABLET | Freq: Every day | ORAL | 1 refills | Status: DC

## 2022-09-26 MED ORDER — CARIPRAZINE HCL 1.5 MG PO CAPS: 1.5000 mg | ORAL_CAPSULE | Freq: Every day | ORAL | 1 refills | Status: DC

## 2022-09-26 NOTE — Progress Notes (Signed)
Brenda Diaz [id number] 1996/11/27 26 y.o.  Subjective:   Patient ID:  Brenda Diaz is a 26 y.o. (DOB 1996-07-08) female.  Chief Complaint: No chief complaint on file.   HPI Brenda Diaz presents to the office today for follow-up of MDD, GAD, ADD and insomnia.  Describes mood today as "ok". Pleasant. Mood symptoms - denies anxiety, depression and irritability. Mood is consistent. Stating "I'm doing good". Feels like medication work well. Working out of [employer] - traveling. Living in [place]. Stable interest and motivation. Taking medications as prescribed.  Energy levels stable. Active, has regular exercise routine. Enjoys some usual interests and activities. Lives alone. No pets. Family live in [place]. Spending time with friends. Talking with family. Appetite adequate. Weight gain - 150 pounds. Sleeps well most nights. Averages 8 to 9 hours. Focus and concentration stable with addition of Ritalin. Completing tasks. Managing aspects of household. Works full-time at [employer]. Denies SI or HI.  Denies AH or VH. Denies self harm. Denies substance use.       Review of Systems:  Review of Systems  Musculoskeletal:  Negative for gait problem.  Neurological:  Negative for tremors.  Psychiatric/Behavioral:         Please refer to HPI    Medications: I have reviewed the patient's current medications.  Current Outpatient Medications  Medication Sig Dispense Refill   cariprazine (VRAYLAR) 1.5 MG capsule Take 1 capsule (1.5 mg total) by mouth daily. 90 capsule 1   methylphenidate (RITALIN) 10 MG tablet Take 1 tablet (10 mg total) by mouth daily. 90 tablet 0   traZODone (DESYREL) 50 MG tablet Take 1 tablet (50 mg total) by mouth at bedtime. 90 tablet 1   TRINTELLIX 20 MG TABS tablet Take 1 tablet (20 mg total) by mouth daily. 90 tablet 1   No current facility-administered medications for this visit.    Medication Side Effects: None  Allergies: No Known Allergies  No past  medical history on file.  Past Medical History, Surgical history, Social history, and Family history were reviewed and updated as appropriate.   Please see review of systems for further details on the patient's review from today.   Objective:   Physical Exam:  There were no vitals taken for this visit.  Physical Exam Constitutional:      General: She is not in acute distress. Musculoskeletal:        General: No deformity.  Neurological:     Mental Status: She is alert and oriented to person, place, and time.     Coordination: Coordination normal.  Psychiatric:        Attention and Perception: Attention and perception normal. She does not perceive auditory or visual hallucinations.        Mood and Affect: Mood normal. Mood is not anxious or depressed. Affect is not labile, blunt, angry or inappropriate.        Speech: Speech normal.        Behavior: Behavior normal.        Thought Content: Thought content normal. Thought content is not paranoid or delusional. Thought content does not include homicidal or suicidal ideation. Thought content does not include homicidal or suicidal plan.        Cognition and Memory: Cognition and memory normal.        Judgment: Judgment normal.     Comments: Insight intact     Lab Review:  No results found for: "NA", "K", "CL", "CO2", "GLUCOSE", "BUN", "CREATININE", "CALCIUM", "PROT", "ALBUMIN", "AST", "ALT", "ALKPHOS", "  BILITOT", "GFRNONAA", "GFRAA"  No results found for: "WBC", "RBC", "HGB", "HCT", "PLT", "MCV", "MCH", "MCHC", "RDW", "LYMPHSABS", "MONOABS", "EOSABS", "BASOSABS"  No results found for: "POCLITH", "LITHIUM"   No results found for: "PHENYTOIN", "PHENOBARB", "VALPROATE", "CBMZ"   .res Assessment: Plan:    Plan:  PDMP reviewed  1. Trintellix 20mg  daily 2. Trazadone 50mg  at bedtime 3. Vraylar 1.5mg  daily 4. Ritalin 10mg  daily  124/70/86  RTC 3 months  Patient advised to contact office with any questions, adverse effects,  or acute worsening in signs and symptoms.  Time spent with patient was 25 minutes. Greater than 50% of face to face time with patient was spent on counseling and coordination of care.  There are no diagnoses linked to this encounter.   Please see After Visit Summary for patient specific instructions.  No future appointments.  No orders of the defined types were placed in this encounter.   -------------------------------

## 2022-12-27 ENCOUNTER — Encounter: Payer: Self-pay | Admitting: Adult Health

## 2022-12-27 ENCOUNTER — Ambulatory Visit (INDEPENDENT_AMBULATORY_CARE_PROVIDER_SITE_OTHER): Payer: Commercial Managed Care - PPO | Admitting: Adult Health

## 2022-12-27 DIAGNOSIS — F908 Attention-deficit hyperactivity disorder, other type: Secondary | ICD-10-CM | POA: Diagnosis not present

## 2022-12-27 DIAGNOSIS — G4700 Insomnia, unspecified: Secondary | ICD-10-CM

## 2022-12-27 DIAGNOSIS — F331 Major depressive disorder, recurrent, moderate: Secondary | ICD-10-CM | POA: Diagnosis not present

## 2022-12-27 DIAGNOSIS — F411 Generalized anxiety disorder: Secondary | ICD-10-CM | POA: Diagnosis not present

## 2022-12-27 MED ORDER — METHYLPHENIDATE HCL 10 MG PO TABS: 10.0000 mg | ORAL_TABLET | Freq: Every day | ORAL | 0 refills | Status: DC

## 2022-12-27 MED ORDER — CARIPRAZINE HCL 1.5 MG PO CAPS: 1.5000 mg | ORAL_CAPSULE | Freq: Every day | ORAL | 1 refills | Status: DC

## 2022-12-27 MED ORDER — TRINTELLIX 20 MG PO TABS: 20.0000 mg | ORAL_TABLET | Freq: Every day | ORAL | 1 refills | Status: DC

## 2022-12-27 NOTE — Progress Notes (Signed)
Brenda Diaz [id number] 01-03-1996 27 y.o.  Subjective:   Patient ID:  Brenda Diaz is a 27 y.o. (DOB 11-Jan-1996) female.  Chief Complaint: No chief complaint on file.   HPI Brenda Diaz presents to the office today for follow-up of MDD, GAD, ADD and insomnia.  Describes mood today as "ok". Pleasant. Mood symptoms - denies anxiety, depression and irritability. Mood is consistent. Stating "I'm doing alright". Feels like medication work well. Stable interest and motivation. Taking medications as prescribed.  Energy levels stable. Active, has regular exercise routine. Enjoys some usual interests and activities. Lives alone. No pets. Family live in [place]. Spending time with friends. Talking with family. Appetite adequate. Weight gain - 150 pounds. Sleeps well most nights. Averages 8 to 9 hours. Focus and concentration stable. Completing tasks. Managing aspects of household. Works full-time at [employer]. Denies SI or HI.  Denies AH or VH. Denies self harm. Denies substance use.      Review of Systems:  Review of Systems  Musculoskeletal:  Negative for gait problem.  Neurological:  Negative for tremors.  Psychiatric/Behavioral:         Please refer to HPI    Medications: I have reviewed the patient's current medications.  Current Outpatient Medications  Medication Sig Dispense Refill   cariprazine (VRAYLAR) 1.5 MG capsule Take 1 capsule (1.5 mg total) by mouth daily. 90 capsule 1   methylphenidate (RITALIN) 10 MG tablet Take 1 tablet (10 mg total) by mouth daily. 90 tablet 0   traZODone (DESYREL) 50 MG tablet Take 1 tablet (50 mg total) by mouth at bedtime. 90 tablet 1   TRINTELLIX 20 MG TABS tablet Take 1 tablet (20 mg total) by mouth daily. 90 tablet 1   No current facility-administered medications for this visit.    Medication Side Effects: None  Allergies: No Known Allergies  No past medical history on file.  Past Medical History, Surgical history, Social history,  and Family history were reviewed and updated as appropriate.   Please see review of systems for further details on the patient's review from today.   Objective:   Physical Exam:  There were no vitals taken for this visit.  Physical Exam Constitutional:      General: She is not in acute distress. Musculoskeletal:        General: No deformity.  Neurological:     Mental Status: She is alert and oriented to person, place, and time.     Coordination: Coordination normal.  Psychiatric:        Attention and Perception: Attention and perception normal. She does not perceive auditory or visual hallucinations.        Mood and Affect: Mood normal. Mood is not anxious or depressed. Affect is not labile, blunt, angry or inappropriate.        Speech: Speech normal.        Behavior: Behavior normal.        Thought Content: Thought content normal. Thought content is not paranoid or delusional. Thought content does not include homicidal or suicidal ideation. Thought content does not include homicidal or suicidal plan.        Cognition and Memory: Cognition and memory normal.        Judgment: Judgment normal.     Comments: Insight intact     Lab Review:  No results found for: "NA", "K", "CL", "CO2", "GLUCOSE", "BUN", "CREATININE", "CALCIUM", "PROT", "ALBUMIN", "AST", "ALT", "ALKPHOS", "BILITOT", "GFRNONAA", "GFRAA"  No results found for: "WBC", "RBC", "HGB", "HCT", "PLT", "MCV", "  MCH", "MCHC", "RDW", "LYMPHSABS", "MONOABS", "EOSABS", "BASOSABS"  No results found for: "POCLITH", "LITHIUM"   No results found for: "PHENYTOIN", "PHENOBARB", "VALPROATE", "CBMZ"   .res Assessment: Plan:    Plan:  PDMP reviewed  1. Trintellix 20mg  daily 2. Trazadone 50mg  at bedtime 3. Vraylar 1.5mg  daily 4. Ritalin 10mg  daily  105/82/75  RTC 3 months  Patient advised to contact office with any questions, adverse effects, or acute worsening in signs and symptoms.  Time spent with patient was 25 minutes.  Greater than 50% of face to face time with patient was spent on counseling and coordination of care.  Diagnoses and all orders for this visit:  Major depressive disorder, recurrent episode, moderate (HCC) -     cariprazine (VRAYLAR) 1.5 MG capsule; Take 1 capsule (1.5 mg total) by mouth daily. -     TRINTELLIX 20 MG TABS tablet; Take 1 tablet (20 mg total) by mouth daily.  Attention deficit hyperactivity disorder (ADHD), other type -     methylphenidate (RITALIN) 10 MG tablet; Take 1 tablet (10 mg total) by mouth daily.  Insomnia, unspecified type  Generalized anxiety disorder -     TRINTELLIX 20 MG TABS tablet; Take 1 tablet (20 mg total) by mouth daily.     Please see After Visit Summary for patient specific instructions.  No future appointments.  No orders of the defined types were placed in this encounter.   -------------------------------

## 2023-03-28 ENCOUNTER — Encounter: Payer: Self-pay | Admitting: Adult Health

## 2023-03-28 ENCOUNTER — Ambulatory Visit (INDEPENDENT_AMBULATORY_CARE_PROVIDER_SITE_OTHER): Payer: Commercial Managed Care - PPO | Admitting: Adult Health

## 2023-03-28 DIAGNOSIS — F331 Major depressive disorder, recurrent, moderate: Secondary | ICD-10-CM

## 2023-03-28 DIAGNOSIS — F908 Attention-deficit hyperactivity disorder, other type: Secondary | ICD-10-CM

## 2023-03-28 DIAGNOSIS — F411 Generalized anxiety disorder: Secondary | ICD-10-CM | POA: Diagnosis not present

## 2023-03-28 DIAGNOSIS — G4700 Insomnia, unspecified: Secondary | ICD-10-CM | POA: Diagnosis not present

## 2023-03-28 MED ORDER — TRINTELLIX 20 MG PO TABS: 20.0000 mg | ORAL_TABLET | Freq: Every day | ORAL | 1 refills | Status: DC

## 2023-03-28 MED ORDER — TRAZODONE HCL 50 MG PO TABS: 50.0000 mg | ORAL_TABLET | Freq: Every day | ORAL | 1 refills | Status: DC

## 2023-03-28 MED ORDER — CARIPRAZINE HCL 1.5 MG PO CAPS: 1.5000 mg | ORAL_CAPSULE | Freq: Every day | ORAL | 1 refills | Status: DC

## 2023-03-28 NOTE — Progress Notes (Signed)
Brenda Diaz [id number] 27-May-1996 27 y.o.  Subjective:   Patient ID:  Brenda Diaz is a 27 y.o. (DOB Nov 12, 1996) female.  Chief Complaint: No chief complaint on file.   HPI Brenda Diaz presents to the office today for follow-up of MDD, GAD, ADD and insomnia.  Describes mood today as "ok". Pleasant. Mood symptoms - denies anxiety, depression and irritability. Denies worry, rumination, and over thinking. Mood is consistent. Stating "I'm doing pretty good". Feels like medication work well. Stable interest and motivation. Taking medications as prescribed.  Energy levels stable. Active, has regular exercise routine. Enjoys some usual interests and activities. Lives alone. No pets. Family live in [place]. Spending time with friends. Talking with family. Appetite adequate. Weight stable - 150 pounds. Sleeps well most nights. Averages 8 to 9 hours. Focus and concentration stable. Completing tasks. Managing aspects of household. Works full-time at [employer]. Denies SI or HI.  Denies AH or VH. Denies self harm. Denies substance use.   Review of Systems:  Review of Systems  Musculoskeletal:  Negative for gait problem.  Neurological:  Negative for tremors.  Psychiatric/Behavioral:         Please refer to HPI    Medications: I have reviewed the patient's current medications.  Current Outpatient Medications  Medication Sig Dispense Refill   cariprazine (VRAYLAR) 1.5 MG capsule Take 1 capsule (1.5 mg total) by mouth daily. 90 capsule 1   methylphenidate (RITALIN) 10 MG tablet Take 1 tablet (10 mg total) by mouth daily. 90 tablet 0   traZODone (DESYREL) 50 MG tablet Take 1 tablet (50 mg total) by mouth at bedtime. 90 tablet 1   TRINTELLIX 20 MG TABS tablet Take 1 tablet (20 mg total) by mouth daily. 90 tablet 1   No current facility-administered medications for this visit.    Medication Side Effects: None  Allergies: No Known Allergies  No past medical history on file.  Past Medical  History, Surgical history, Social history, and Family history were reviewed and updated as appropriate.   Please see review of systems for further details on the patient's review from today.   Objective:   Physical Exam:  There were no vitals taken for this visit.  Physical Exam Constitutional:      General: She is not in acute distress. Musculoskeletal:        General: No deformity.  Neurological:     Mental Status: She is alert and oriented to person, place, and time.     Coordination: Coordination normal.  Psychiatric:        Attention and Perception: Attention and perception normal. She does not perceive auditory or visual hallucinations.        Mood and Affect: Mood normal. Mood is not anxious or depressed. Affect is not labile, blunt, angry or inappropriate.        Speech: Speech normal.        Behavior: Behavior normal.        Thought Content: Thought content normal. Thought content is not paranoid or delusional. Thought content does not include homicidal or suicidal ideation. Thought content does not include homicidal or suicidal plan.        Cognition and Memory: Cognition and memory normal.        Judgment: Judgment normal.     Comments: Insight intact     Lab Review:  No results found for: "NA", "K", "CL", "CO2", "GLUCOSE", "BUN", "CREATININE", "CALCIUM", "PROT", "ALBUMIN", "AST", "ALT", "ALKPHOS", "BILITOT", "GFRNONAA", "GFRAA"  No results found for: "WBC", "RBC", "  HGB", "HCT", "PLT", "MCV", "MCH", "MCHC", "RDW", "LYMPHSABS", "MONOABS", "EOSABS", "BASOSABS"  No results found for: "POCLITH", "LITHIUM"   No results found for: "PHENYTOIN", "PHENOBARB", "VALPROATE", "CBMZ"   .res Assessment: Plan:    Plan:  PDMP reviewed  1. Trintellix  daily 2. Trazadone  at bedtime 3. Vraylar 1.5mg  daily 4. Ritalin  daily - taking once every 2 weeks.  Monitor BP between visits while taking stimulant medication.   RTC 3 months  Patient advised to contact  office with any questions, adverse effects, or acute worsening in signs and symptoms.  Time spent with patient was 25 minutes. Greater than 50% of face to face time with patient was spent on counseling and coordination of care.    Diagnoses and all orders for this visit:  Insomnia, unspecified type -     traZODone (DESYREL) 50 MG tablet; Take 1 tablet (50 mg total) by mouth at bedtime.  Major depressive disorder, recurrent episode, moderate -     TRINTELLIX 20 MG TABS tablet; Take 1 tablet (20 mg total) by mouth daily. -     cariprazine (VRAYLAR) 1.5 MG capsule; Take 1 capsule (1.5 mg total) by mouth daily.  Generalized anxiety disorder -     TRINTELLIX 20 MG TABS tablet; Take 1 tablet (20 mg total) by mouth daily.  Attention deficit hyperactivity disorder (ADHD), other type     Please see After Visit Summary for patient specific instructions.  No future appointments.   No orders of the defined types were placed in this encounter.   -------------------------------

## 2023-07-08 ENCOUNTER — Ambulatory Visit: Payer: Commercial Managed Care - PPO | Admitting: Adult Health

## 2023-07-15 ENCOUNTER — Ambulatory Visit (INDEPENDENT_AMBULATORY_CARE_PROVIDER_SITE_OTHER): Payer: Commercial Managed Care - PPO | Admitting: Adult Health

## 2023-07-15 ENCOUNTER — Encounter: Payer: Self-pay | Admitting: Adult Health

## 2023-07-15 DIAGNOSIS — F908 Attention-deficit hyperactivity disorder, other type: Secondary | ICD-10-CM | POA: Diagnosis not present

## 2023-07-15 DIAGNOSIS — G4700 Insomnia, unspecified: Secondary | ICD-10-CM

## 2023-07-15 DIAGNOSIS — F331 Major depressive disorder, recurrent, moderate: Secondary | ICD-10-CM

## 2023-07-15 DIAGNOSIS — F411 Generalized anxiety disorder: Secondary | ICD-10-CM | POA: Diagnosis not present

## 2023-07-15 MED ORDER — METHYLPHENIDATE HCL 10 MG PO TABS: 10.0000 mg | ORAL_TABLET | Freq: Every day | ORAL | 0 refills | Status: DC

## 2023-07-15 NOTE — Progress Notes (Signed)
Keyanda Hetman [id number] 02-Dec-1996 27 y.o.  Subjective:   Patient ID:  Araia Ledoux is a 27 y.o. (DOB July 01, 1996) female.  Chief Complaint: No chief complaint on file.   HPI Modene Cloer presents to the office today for follow-up of MDD, GAD, ADD, and insomnia.  Describes mood today as "ok". Pleasant. Mood symptoms - denies anxiety, depression and irritability. Denies worry, rumination, and over thinking. Mood is consistent. Stating "I'm pretty good". Feels like medication work well. Stable interest and motivation. Taking medications as prescribed.  Energy levels stable. Active, has regular exercise routine. Enjoys some usual interests and activities. Lives alone. No pets. Family live in [place]. Spending time with friends. Talking with family. Appetite adequate. Weight stable - 150 pounds. Sleeps well most nights. Averages 8 to 9 hours. Focus and concentration stable. Completing tasks. Managing aspects of household. Works full-time at [employer]. Denies SI or HI.  Denies AH or VH. Denies self harm. Denies substance use.    Review of Systems:  Review of Systems  Musculoskeletal:  Negative for gait problem.  Neurological:  Negative for tremors.  Psychiatric/Behavioral:         Please refer to HPI    Medications: I have reviewed the patient's current medications.  Current Outpatient Medications  Medication Sig Dispense Refill   cariprazine (VRAYLAR) 1.5 MG capsule Take 1 capsule (1.5 mg total) by mouth daily. 90 capsule 1   methylphenidate (RITALIN) 10 MG tablet Take 1 tablet (10 mg total) by mouth daily. 90 tablet 0   traZODone (DESYREL) 50 MG tablet Take 1 tablet (50 mg total) by mouth at bedtime. 90 tablet 1   TRINTELLIX 20 MG TABS tablet Take 1 tablet (20 mg total) by mouth daily. 90 tablet 1   No current facility-administered medications for this visit.    Medication Side Effects: None  Allergies: No Known Allergies  No past medical history on file.  Past Medical  History, Surgical history, Social history, and Family history were reviewed and updated as appropriate.   Please see review of systems for further details on the patient's review from today.   Objective:   Physical Exam:  There were no vitals taken for this visit.  Physical Exam Constitutional:      General: She is not in acute distress. Musculoskeletal:        General: No deformity.  Neurological:     Mental Status: She is alert and oriented to person, place, and time.     Coordination: Coordination normal.  Psychiatric:        Attention and Perception: Attention and perception normal. She does not perceive auditory or visual hallucinations.        Mood and Affect: Affect is not labile, blunt, angry or inappropriate.        Speech: Speech normal.        Behavior: Behavior normal.        Thought Content: Thought content normal. Thought content is not paranoid or delusional. Thought content does not include homicidal or suicidal ideation. Thought content does not include homicidal or suicidal plan.        Cognition and Memory: Cognition and memory normal.        Judgment: Judgment normal.     Comments: Insight intact     Lab Review:  No results found for: "NA", "K", "CL", "CO2", "GLUCOSE", "BUN", "CREATININE", "CALCIUM", "PROT", "ALBUMIN", "AST", "ALT", "ALKPHOS", "BILITOT", "GFRNONAA", "GFRAA"  No results found for: "WBC", "RBC", "HGB", "HCT", "PLT", "MCV", "MCH", "MCHC", "RDW", "LYMPHSABS", "  MONOABS", "EOSABS", "BASOSABS"  No results found for: "POCLITH", "LITHIUM"   No results found for: "PHENYTOIN", "PHENOBARB", "VALPROATE", "CBMZ"   .res Assessment: Plan:    Assessment: Plan:    Plan:  PDMP reviewed  1. Trintellix 20mg  daily 2. Trazadone 50mg  at bedtime 3. Vraylar 1.5mg  daily 4. Ritalin 10mg  daily - taking once every 2 weeks.  Monitor BP between visits while taking stimulant medication.   RTC 3 months  Patient advised to contact office with any questions,  adverse effects, or acute worsening in signs and symptoms.  Time spent with patient was 25 minutes. Greater than 50% of face to face time with patient was spent on counseling and coordination of care.   There are no diagnoses linked to this encounter.   Please see After Visit Summary for patient specific instructions.  Future Appointments  Date Time Provider Department Center  07/15/2023  8:40 AM Evaleigh Mccamy, Thereasa Solo, NP [facility] None    No orders of the defined types were placed in this encounter.   -------------------------------

## 2023-10-15 ENCOUNTER — Ambulatory Visit (INDEPENDENT_AMBULATORY_CARE_PROVIDER_SITE_OTHER): Payer: Commercial Managed Care - PPO | Admitting: Adult Health

## 2023-10-15 ENCOUNTER — Encounter: Payer: Self-pay | Admitting: Adult Health

## 2023-10-15 DIAGNOSIS — G4700 Insomnia, unspecified: Secondary | ICD-10-CM | POA: Diagnosis not present

## 2023-10-15 DIAGNOSIS — F908 Attention-deficit hyperactivity disorder, other type: Secondary | ICD-10-CM | POA: Diagnosis not present

## 2023-10-15 DIAGNOSIS — F411 Generalized anxiety disorder: Secondary | ICD-10-CM | POA: Diagnosis not present

## 2023-10-15 DIAGNOSIS — F331 Major depressive disorder, recurrent, moderate: Secondary | ICD-10-CM | POA: Diagnosis not present

## 2023-10-15 MED ORDER — TRAZODONE HCL 50 MG PO TABS: 50.0000 mg | ORAL_TABLET | Freq: Every day | ORAL | 1 refills | Status: DC

## 2023-10-15 MED ORDER — TRINTELLIX 20 MG PO TABS: 20.0000 mg | ORAL_TABLET | Freq: Every day | ORAL | 1 refills | Status: DC

## 2023-10-15 MED ORDER — CARIPRAZINE HCL 1.5 MG PO CAPS: 1.5000 mg | ORAL_CAPSULE | Freq: Every day | ORAL | 1 refills | Status: DC

## 2023-10-15 MED ORDER — METHYLPHENIDATE HCL 10 MG PO TABS: 10.0000 mg | ORAL_TABLET | Freq: Every day | ORAL | 0 refills | Status: DC

## 2023-10-15 NOTE — Progress Notes (Signed)
Breindel Collier [id number] 1996-07-31 27 y.o.  Subjective:   Patient ID:  Brenda Diaz is a 27 y.o. (DOB May 29, 1996) female.  Chief Complaint: No chief complaint on file.   HPI Brenda Diaz presents to the office today for follow-up of MDD, GAD, ADD, and insomnia.  Describes mood today as "ok". Pleasant. Mood symptoms - denies anxiety, depression and irritability. Denies worry, rumination, and over thinking. Mood is consistent. Stating "I feel like I'm doing pretty good". Feels like medication work well. Stable interest and motivation. Taking medications as prescribed.  Energy levels stable. Active, has regular exercise routine. Enjoys some usual interests and activities. Lives alone. No pets. Family live in [place]. Spending time with friends. Talking with family. Appetite adequate. Weight stable - 150 pounds. Sleeps well most nights. Averages 8 to 9 hours. Focus and concentration stable. Completing tasks. Managing aspects of household. Works full-time at [employer]. Denies SI or HI.  Denies AH or VH. Denies self harm. Denies substance use.        Review of Systems:  Review of Systems  Musculoskeletal:  Negative for gait problem.  Neurological:  Negative for tremors.  Psychiatric/Behavioral:         Please refer to HPI    Medications: I have reviewed the patient's current medications.  Current Outpatient Medications  Medication Sig Dispense Refill   cariprazine (VRAYLAR) 1.5 MG capsule Take 1 capsule (1.5 mg total) by mouth daily. 90 capsule 1   methylphenidate (RITALIN) 10 MG tablet Take 1 tablet (10 mg total) by mouth daily. 90 tablet 0   traZODone (DESYREL) 50 MG tablet Take 1 tablet (50 mg total) by mouth at bedtime. 90 tablet 1   TRINTELLIX 20 MG TABS tablet Take 1 tablet (20 mg total) by mouth daily. 90 tablet 1   No current facility-administered medications for this visit.    Medication Side Effects: None  Allergies: No Known Allergies  No past medical history on  file.  Past Medical History, Surgical history, Social history, and Family history were reviewed and updated as appropriate.   Please see review of systems for further details on the patient's review from today.   Objective:   Physical Exam:  There were no vitals taken for this visit.  Physical Exam Constitutional:      General: She is not in acute distress. Musculoskeletal:        General: No deformity.  Neurological:     Mental Status: She is alert and oriented to person, place, and time.     Coordination: Coordination normal.  Psychiatric:        Attention and Perception: Attention and perception normal. She does not perceive auditory or visual hallucinations.        Mood and Affect: Mood normal. Mood is not anxious or depressed. Affect is not labile, blunt, angry or inappropriate.        Speech: Speech normal.        Behavior: Behavior normal.        Thought Content: Thought content normal. Thought content is not paranoid or delusional. Thought content does not include homicidal or suicidal ideation. Thought content does not include homicidal or suicidal plan.        Cognition and Memory: Cognition and memory normal.        Judgment: Judgment normal.     Comments: Insight intact     Lab Review:  No results found for: "NA", "K", "CL", "CO2", "GLUCOSE", "BUN", "CREATININE", "CALCIUM", "PROT", "ALBUMIN", "AST", "ALT", "ALKPHOS", "BILITOT", "GFRNONAA", "  GFRAA"  No results found for: "WBC", "RBC", "HGB", "HCT", "PLT", "MCV", "MCH", "MCHC", "RDW", "LYMPHSABS", "MONOABS", "EOSABS", "BASOSABS"  No results found for: "POCLITH", "LITHIUM"   No results found for: "PHENYTOIN", "PHENOBARB", "VALPROATE", "CBMZ"   .res Assessment: Plan:   Plan:  PDMP reviewed  1. Trintellix 20mg  daily 2. Trazadone 50mg  at bedtime 3. Vraylar 1.5mg  daily 4. Ritalin 10mg  daily - taking once every 2 weeks.  Monitor BP between visits while taking stimulant medication.   RTC 3 months  Patient  advised to contact office with any questions, adverse effects, or acute worsening in signs and symptoms.  Time spent with patient was 25 minutes. Greater than 50% of face to face time with patient was spent on counseling and coordination of care.   There are no diagnoses linked to this encounter.   Please see After Visit Summary for patient specific instructions.  Future Appointments  Date Time Provider Department Center  10/15/2023  8:40 AM Michael Walrath, Thereasa Solo, NP [facility] None    No orders of the defined types were placed in this encounter.   -------------------------------

## 2024-01-27 ENCOUNTER — Encounter: Payer: Self-pay | Admitting: Adult Health

## 2024-01-27 ENCOUNTER — Ambulatory Visit: Payer: Commercial Managed Care - PPO | Admitting: Adult Health

## 2024-01-27 DIAGNOSIS — G4700 Insomnia, unspecified: Secondary | ICD-10-CM

## 2024-01-27 DIAGNOSIS — F331 Major depressive disorder, recurrent, moderate: Secondary | ICD-10-CM

## 2024-01-27 DIAGNOSIS — F411 Generalized anxiety disorder: Secondary | ICD-10-CM

## 2024-01-27 MED ORDER — TRAZODONE HCL 50 MG PO TABS: 50.0000 mg | ORAL_TABLET | Freq: Every day | ORAL | 1 refills | Status: DC

## 2024-01-27 MED ORDER — TRINTELLIX 20 MG PO TABS: 20.0000 mg | ORAL_TABLET | Freq: Every day | ORAL | 1 refills | Status: DC

## 2024-01-27 MED ORDER — CARIPRAZINE HCL 1.5 MG PO CAPS: 1.5000 mg | ORAL_CAPSULE | Freq: Every day | ORAL | 1 refills | Status: DC

## 2024-01-27 NOTE — Progress Notes (Signed)
Brenda Diaz [id number] Apr 14, 1996 27 y.o.  Subjective:   Patient ID:  Brenda Diaz is a 28 y.o. (DOB 15-Jul-1996) female.  Chief Complaint: No chief complaint on file.   HPI Brenda Diaz presents to the office today for follow-up of MDD, GAD, ADD, and insomnia.  Describes mood today as "ok". Pleasant. Mood symptoms - denies anxiety, depression and irritability. Denies panic attacks. Denies worry, rumination, and over thinking. Mood is consistent. Stating "I feel like I'm doing alright". Feels like medication work well. Stable interest and motivation. Taking medications as prescribed.  Energy levels stable. Active, has regular exercise routine. Enjoys some usual interests and activities. Lives alone. No pets. Family live in [place]. Spending time with friends. Talking with family. Appetite adequate. Weight stable - 150 pounds. Sleeps well most nights. Averages 8 to 9 hours. Focus and concentration stable. Completing tasks. Managing aspects of household. Works full-time at [employer]. Denies SI or HI.  Denies AH or VH. Denies self harm. Denies substance use.      Review of Systems:  Review of Systems  Musculoskeletal:  Negative for gait problem.  Neurological:  Negative for tremors.  Psychiatric/Behavioral:         Please refer to HPI    Medications: I have reviewed the patient's current medications.  Current Outpatient Medications  Medication Sig Dispense Refill   cariprazine (VRAYLAR) 1.5 MG capsule Take 1 capsule (1.5 mg total) by mouth daily. 90 capsule 1   methylphenidate (RITALIN) 10 MG tablet Take 1 tablet (10 mg total) by mouth daily. 90 tablet 0   traZODone (DESYREL) 50 MG tablet Take 1 tablet (50 mg total) by mouth at bedtime. 90 tablet 1   TRINTELLIX 20 MG TABS tablet Take 1 tablet (20 mg total) by mouth daily. 90 tablet 1   No current facility-administered medications for this visit.    Medication Side Effects: None  Allergies: No Known Allergies  No past  medical history on file.  Past Medical History, Surgical history, Social history, and Family history were reviewed and updated as appropriate.   Please see review of systems for further details on the patient's review from today.   Objective:   Physical Exam:  There were no vitals taken for this visit.  Physical Exam Constitutional:      General: She is not in acute distress. Musculoskeletal:        General: No deformity.  Neurological:     Mental Status: She is alert and oriented to person, place, and time.     Coordination: Coordination normal.  Psychiatric:        Attention and Perception: Attention and perception normal. She does not perceive auditory or visual hallucinations.        Mood and Affect: Mood normal. Mood is not anxious or depressed. Affect is not labile, blunt, angry or inappropriate.        Speech: Speech normal.        Behavior: Behavior normal.        Thought Content: Thought content normal. Thought content is not paranoid or delusional. Thought content does not include homicidal or suicidal ideation. Thought content does not include homicidal or suicidal plan.        Cognition and Memory: Cognition and memory normal.        Judgment: Judgment normal.     Comments: Insight intact     Lab Review:  No results found for: "NA", "K", "CL", "CO2", "GLUCOSE", "BUN", "CREATININE", "CALCIUM", "PROT", "ALBUMIN", "AST", "ALT", "ALKPHOS", "BILITOT", "GFRNONAA", "  GFRAA"  No results found for: "WBC", "RBC", "HGB", "HCT", "PLT", "MCV", "MCH", "MCHC", "RDW", "LYMPHSABS", "MONOABS", "EOSABS", "BASOSABS"  No results found for: "POCLITH", "LITHIUM"   No results found for: "PHENYTOIN", "PHENOBARB", "VALPROATE", "CBMZ"   .res Assessment: Plan:   Plan:  PDMP reviewed  1. Trintellix 20mg  daily 2. Trazadone 50mg  at bedtime 3. Vraylar 1.5mg  daily  D/C Ritalin 10mg  daily - taking once every 2 weeks.  Monitor BP between visits while taking stimulant medication.   RTC 6  months  Patient advised to contact office with any questions, adverse effects, or acute worsening in signs and symptoms.  There are no diagnoses linked to this encounter.   Please see After Visit Summary for patient specific instructions.  No future appointments.  No orders of the defined types were placed in this encounter.   -------------------------------

## 2024-05-08 ENCOUNTER — Telehealth: Payer: Self-pay | Admitting: Adult Health

## 2024-05-08 NOTE — Telephone Encounter (Signed)
 Pt called at 1:47p requesting refill of Trintellix  to  [id number] - [place], [place] - [address] [place] [address] Phone: [phone]  Fax: [phone]   Next appt 8/18

## 2024-05-08 NOTE — Telephone Encounter (Signed)
 Rx was sent in Feb for #90 with 1 RF. Patient notified.

## 2024-07-27 ENCOUNTER — Ambulatory Visit (INDEPENDENT_AMBULATORY_CARE_PROVIDER_SITE_OTHER): Payer: Commercial Managed Care - PPO | Admitting: Adult Health

## 2024-07-27 ENCOUNTER — Encounter: Payer: Self-pay | Admitting: Adult Health

## 2024-07-27 DIAGNOSIS — F331 Major depressive disorder, recurrent, moderate: Secondary | ICD-10-CM | POA: Diagnosis not present

## 2024-07-27 DIAGNOSIS — G4700 Insomnia, unspecified: Secondary | ICD-10-CM

## 2024-07-27 DIAGNOSIS — F411 Generalized anxiety disorder: Secondary | ICD-10-CM

## 2024-07-27 DIAGNOSIS — Z0389 Encounter for observation for other suspected diseases and conditions ruled out: Secondary | ICD-10-CM

## 2024-07-27 DIAGNOSIS — F908 Attention-deficit hyperactivity disorder, other type: Secondary | ICD-10-CM

## 2024-07-27 MED ORDER — TRAZODONE HCL 50 MG PO TABS
50.0000 mg | ORAL_TABLET | Freq: Every day | ORAL | 1 refills | Status: AC
Start: 2024-07-27 — End: ?

## 2024-07-27 MED ORDER — TRINTELLIX 20 MG PO TABS: 20.0000 mg | ORAL_TABLET | Freq: Every day | ORAL | 1 refills | Status: DC

## 2024-07-27 NOTE — Addendum Note (Signed)
 Addended by: LAWERANCE ANGELINE SAILOR on: 07/27/2024 09:34 AM   Modules accepted: Orders, Level of Service

## 2024-07-27 NOTE — Progress Notes (Addendum)
 Barrett Holthaus [id number] 08-20-1996 28 y.o.  Subjective:   Patient ID:  Brenda Diaz is a 28 y.o. (DOB [birth year]) female.  Chief Complaint: No chief complaint on file.   HPI Brenda Diaz presents to the office today for follow-up of MDD, GAD, ADD, and insomnia.  Describes mood today as ok. Pleasant. Mood symptoms - denies anxiety, depression and irritability. Reports stable interest and motivation. Denies panic attacks. Denies worry, rumination and over thinking. Reports mood is consistent. Stating I feel like I'm doing ok. Feels like medication work well.  Taking medications as prescribed.  Energy levels stable. Active, has a regular exercise routine. Enjoys some usual interests and activities. Lives alone. No pets. Family live in [place]. Spending time with friends. Talking with family. Appetite adequate. Weight loss - 140 pounds. Sleeps well most nights. Averages 8 to 9 hours. Focus and concentration stable. Completing tasks. Managing aspects of household. Works full-time at [employer]. Denies SI or HI.  Denies AH or VH. Denies self harm. Denies substance use. Working with a Paramedic weekly.   Review of Systems:  Review of Systems  Musculoskeletal:  Negative for gait problem.  Neurological:  Negative for tremors.  Psychiatric/Behavioral:         Please refer to HPI    Medications: I have reviewed the patient's current medications.  Current Outpatient Medications  Medication Sig Dispense Refill   cariprazine  (VRAYLAR ) 1.5 MG capsule Take 1 capsule (1.5 mg total) by mouth daily. 90 capsule 1   methylphenidate  (RITALIN ) 10 MG tablet Take 1 tablet (10 mg total) by mouth daily. 90 tablet 0   traZODone  (DESYREL ) 50 MG tablet Take 1 tablet (50 mg total) by mouth at bedtime. 90 tablet 1   TRINTELLIX  20 MG TABS tablet Take 1 tablet (20 mg total) by mouth daily. 90 tablet 1   No current facility-administered medications for this visit.    Medication Side Effects:  None  Allergies: No Known Allergies  No past medical history on file.  Past Medical History, Surgical history, Social history, and Family history were reviewed and updated as appropriate.   Please see review of systems for further details on the patient's review from today.   Objective:   Physical Exam:  There were no vitals taken for this visit.  Physical Exam Constitutional:      General: She is not in acute distress. Musculoskeletal:        General: No deformity.  Neurological:     Mental Status: She is alert and oriented to person, place, and time.     Coordination: Coordination normal.  Psychiatric:        Attention and Perception: Attention and perception normal. She does not perceive auditory or visual hallucinations.        Mood and Affect: Mood normal. Mood is not anxious or depressed. Affect is not labile, blunt, angry or inappropriate.        Speech: Speech normal.        Behavior: Behavior normal.        Thought Content: Thought content normal. Thought content is not paranoid or delusional. Thought content does not include homicidal or suicidal ideation. Thought content does not include homicidal or suicidal plan.        Cognition and Memory: Cognition and memory normal.        Judgment: Judgment normal.     Comments: Insight intact     Lab Review:  No results found for: NA, K, CL, CO2, GLUCOSE, BUN, CREATININE, CALCIUM, PROT,  ALBUMIN, AST, ALT, ALKPHOS, BILITOT, GFRNONAA, GFRAA  No results found for: WBC, RBC, HGB, HCT, PLT, MCV, MCH, MCHC, RDW, LYMPHSABS, MONOABS, EOSABS, BASOSABS  No results found for: POCLITH, LITHIUM   No results found for: PHENYTOIN, PHENOBARB, VALPROATE, CBMZ   .res Assessment: Plan:    Plan:  PDMP reviewed  1. Trintellix  20mg  daily 2. Trazadone 50mg  at bedtime  D/C Vraylar  1.5mg  daily  Monitor BP between visits while taking stimulant medication.   RTC 6  months  Patient advised to contact office with any questions, adverse effects, or acute worsening in signs and symptoms.  Discussed potential metabolic side effects associated with atypical antipsychotics, as well as potential risk for movement side effects. Advised pt to contact office if movement side effects occur.     Diagnoses and all orders for this visit:  No diagnosis on Axis I     Please see After Visit Summary for patient specific instructions.  No future appointments.  No orders of the defined types were placed in this encounter.   -------------------------------

## 2024-09-03 ENCOUNTER — Telehealth (INDEPENDENT_AMBULATORY_CARE_PROVIDER_SITE_OTHER): Admitting: Adult Health

## 2024-09-03 ENCOUNTER — Encounter: Payer: Self-pay | Admitting: Adult Health

## 2024-09-03 DIAGNOSIS — F908 Attention-deficit hyperactivity disorder, other type: Secondary | ICD-10-CM

## 2024-09-03 DIAGNOSIS — F411 Generalized anxiety disorder: Secondary | ICD-10-CM | POA: Diagnosis not present

## 2024-09-03 DIAGNOSIS — G4700 Insomnia, unspecified: Secondary | ICD-10-CM | POA: Diagnosis not present

## 2024-09-03 DIAGNOSIS — F331 Major depressive disorder, recurrent, moderate: Secondary | ICD-10-CM

## 2024-09-03 DIAGNOSIS — F909 Attention-deficit hyperactivity disorder, unspecified type: Secondary | ICD-10-CM

## 2024-09-03 DIAGNOSIS — F1994 Other psychoactive substance use, unspecified with psychoactive substance-induced mood disorder: Secondary | ICD-10-CM | POA: Diagnosis not present

## 2024-09-03 MED ORDER — RISPERIDONE 1 MG PO TABS: ORAL_TABLET | ORAL | 2 refills | Status: DC

## 2024-09-03 NOTE — Progress Notes (Addendum)
 Brenda Diaz [id number] [birth year] 28 y.o.  Virtual Visit via Video Note  I connected with pt @ on 09/03/24 at  9:00 AM EDT by a video enabled telemedicine application and verified that I am speaking with the correct person using two identifiers.   I discussed the limitations of evaluation and management by telemedicine and the availability of in person appointments. The patient expressed understanding and agreed to proceed.  I discussed the assessment and treatment plan with the patient. The patient was provided an opportunity to ask questions and all were answered. The patient agreed with the plan and demonstrated an understanding of the instructions.   The patient was advised to call back or seek an in-person evaluation if the symptoms worsen or if the condition fails to improve as anticipated.  I provided 40 minutes of non-face-to-face time during this encounter.  The patient was located at home.  The provider was located at [facility].   Angeline LOISE Sayers, NP   Subjective:   Patient ID:  Brenda Diaz is a 28 y.o. (DOB [birth year]) female.  Chief Complaint: No chief complaint on file.   HPI Caron Ode presents for follow-up of MDD, GAD, ADD, and insomnia.  Describes mood today as not too good. Pleasant. Reports concerns surrounding work. Reports feeling stressed out over the past several months. Reports increased paranoia surrounding work environment starting in April. Reports she started using marijuana on a daily basis in January to help her cope/relax. Reports she has now gotten to a point where she can no longer do her job - it's consuming me. She has requested FMLA to work on mood symptoms. Mood symptoms - reports anxiety, depression and irritability. Reports decreased interest and motivation. Reports panic attacks. Reports worry, rumination and over thinking. Reports mood is variable - ups and downs. Stating I don't feel like I'm doing too good. Reports  feeling stressed out over the past few months. She stopped taking the Vraylar  - I didn't think I needed it. She is uncertain if current medications are helping her. Reports she has not been consistent with medications.  Reports feeling so stressed out that she can't eat - losing weight. Reports people are spreading rumors about her. Reports demeaning comments - people are trying to push me out of my job.  Energy levels not great. Active, has not been exercising regularly. Reports she has difficulties enjoying usual interests and activities. Lives alone. No pets. Family lives in [place]. Spending time with friends. Talking with family. Appetite adequate - slightly better. Weight loss 30 pounds over the past 6 months - 135 from 140 pounds. Sleeps well most nights. Averages 6 hours over the past month - previously 8 to 9 hours. Reports focus and concentration difficulties - more so in the work setting. Completing tasks. Managing aspects of household. Works full-time at [employer], but has been out of work since August 24, 2024. Denies SI or HI.  Denies AH or VH. Denies self harm. Reports marijuana use - had been using it daily over the past several months. Working with a Paramedic weekly.   Review of Systems:  Review of Systems  Musculoskeletal:  Negative for gait problem.  Neurological:  Negative for tremors.  Psychiatric/Behavioral:         Please refer to HPI    Medications: I have reviewed the patient's current medications.  Current Outpatient Medications  Medication Sig Dispense Refill   cariprazine  (VRAYLAR ) 1.5 MG capsule Take 1 capsule (1.5 mg total) by mouth daily. 90  capsule 1   methylphenidate  (RITALIN ) 10 MG tablet Take 1 tablet (10 mg total) by mouth daily. 90 tablet 0   traZODone  (DESYREL ) 50 MG tablet Take 1 tablet (50 mg total) by mouth at bedtime. 90 tablet 1   TRINTELLIX  20 MG TABS tablet Take 1 tablet (20 mg total) by mouth daily. 90 tablet 1   No current  facility-administered medications for this visit.    Medication Side Effects: None  Allergies: No Known Allergies  No past medical history on file.  No family history on file.  Social History   Socioeconomic History   Marital status: Single    Spouse name: Not on file   Number of children: Not on file   Years of education: Not on file   Highest education level: Not on file  Occupational History   Not on file  Tobacco Use   Smoking status: Never   Smokeless tobacco: Never  Substance and Sexual Activity   Alcohol use: Yes    Comment: 2 to 3 drinks on the weekend.    Drug use: Not Currently   Sexual activity: Not on file  Other Topics Concern   Not on file  Social History Narrative   Not on file   Social Drivers of Health   Financial Resource Strain: Low Risk  (01/15/2024)   Received [facility]   Overall Financial Resource Strain (CARDIA)    Difficulty of Paying Living Expenses: Not hard at all  Food Insecurity: No Food Insecurity (01/15/2024)   Received from [facility]   Hunger Vital Sign    Within the past 12 months, you worried that your food would run out before you got the money to buy more.: Never true    Within the past 12 months, the food you bought just didn't last and you didn't have money to get more.: Never true  Transportation Needs: No Transportation Needs (01/15/2024)   Received [facility]    Lack of Transportation (Medical): No    Lack of Transportation (Non-Medical): No  Physical Activity: Sufficiently Active (01/14/2023)   Received from [facility]   Exercise Vital Sign    On average, how many days per week do you engage in moderate to strenuous exercise (like a brisk walk)?: 5 days    On average, how many minutes do you engage in exercise at this level?: 50 min  Stress: No Stress Concern Present (01/14/2023)   Received from [facility] - Occupational Stress  Questionnaire    Feeling of Stress : Only a little  Social Connections: Moderately Integrated (01/14/2023)   Received from [facility]   Social Network    How would you rate your social network (family, work, friends)?: Adequate participation with social networks  Intimate Partner Violence: Not At Risk (01/14/2023)   Received from [facility]   HITS    Over the last 12 months how often did your partner physically hurt you?: Never    Over the last 12 months how often did your partner insult you or talk down to you?: Never    Over the last 12 months how often did your partner threaten you with physical harm?: Never    Over the last 12 months how often did your partner scream or curse at you?: Never    Past Medical History, Surgical history, Social history, and Family history were reviewed and updated as appropriate.   Please see  review of systems for further details on the patient's review from today.   Objective:   Physical Exam:  There were no vitals taken for this visit.  Physical Exam Constitutional:      General: She is not in acute distress. Musculoskeletal:        General: No deformity.  Neurological:     Mental Status: She is alert and oriented to person, place, and time.     Coordination: Coordination normal.  Psychiatric:        Attention and Perception: Attention and perception normal. She does not perceive auditory or visual hallucinations.        Mood and Affect: Mood normal. Mood is not anxious or depressed. Affect is not labile, blunt, angry or inappropriate.        Speech: Speech normal.        Behavior: Behavior normal.        Thought Content: Thought content normal. Thought content is not paranoid or delusional. Thought content does not include homicidal or suicidal ideation. Thought content does not include homicidal or suicidal plan.        Cognition and Memory: Cognition and memory normal.        Judgment: Judgment normal.     Comments: Insight intact      Lab Review:  No results found for: NA, K, CL, CO2, GLUCOSE, BUN, CREATININE, CALCIUM, PROT, ALBUMIN, AST, ALT, ALKPHOS, BILITOT, GFRNONAA, GFRAA  No results found for: WBC, RBC, HGB, HCT, PLT, MCV, MCH, MCHC, RDW, LYMPHSABS, MONOABS, EOSABS, BASOSABS  No results found for: POCLITH, LITHIUM   No results found for: PHENYTOIN, PHENOBARB, VALPROATE, CBMZ   .res Assessment: Plan:    Plan:  PDMP reviewed  1. Trintellix  20mg  daily 2. Trazadone 50mg  at bedtime 3. Add - Risperdal  1mg  at bedtime - take 1/2 tablet at bedtime for 3 nights, then increase to 1mg  daily for mood symptoms - possible substance induced paranoia/mood disorder with THC use.  RTC 1 weeks  40 minutes spent dedicated to the care of this patient on the date of this encounter to include pre-visit review of records, ordering of medication, post visit documentation, and face-to-face time with the patient discussing THC - substance abuse, depression, anxiety, insomnia and obsessional thoughts. She is out of work currently and is unable to work until mood symptoms are managed. She has asked employer for FMLA to cover period of 08/24/2024 through 09/14/2024.  Patient advised to contact office with any questions, adverse effects, or acute worsening in signs and symptoms.  Discussed potential metabolic side effects associated with atypical antipsychotics, as well as potential risk for movement side effects. Advised pt to contact office if movement side effects occur.    There are no diagnoses linked to this encounter.   Please see After Visit Summary for patient specific instructions.  Future Appointments  Date Time Provider Department Center  09/03/2024  9:00 AM Shain Pauwels Nattalie, NP [facility] None  01/27/2025  8:30 AM Keyaan Lederman Nattalie, NP [facility] None    No orders of the defined types were placed in this encounter.      -------------------------------

## 2024-09-07 ENCOUNTER — Telehealth: Payer: Self-pay | Admitting: Adult Health

## 2024-09-07 DIAGNOSIS — Z0289 Encounter for other administrative examinations: Secondary | ICD-10-CM

## 2024-09-07 NOTE — Telephone Encounter (Signed)
 Received fax 9/26 from [place].  FMLA forms to complete.  Called pt had to lvm advising charge to complete $45. Must collect before completion.

## 2024-09-07 NOTE — Telephone Encounter (Signed)
 Pt called back to make payment for FMLA forms. Forms given to Traci.

## 2024-09-07 NOTE — Telephone Encounter (Signed)
 ERROR

## 2024-09-10 ENCOUNTER — Encounter: Payer: Self-pay | Admitting: Adult Health

## 2024-09-10 ENCOUNTER — Telehealth: Payer: Self-pay | Admitting: Adult Health

## 2024-09-10 DIAGNOSIS — Z0389 Encounter for observation for other suspected diseases and conditions ruled out: Secondary | ICD-10-CM

## 2024-09-10 NOTE — Progress Notes (Signed)
 Patient no show appointment. Did not connect for video appointment. Called and LVM for patient to return call.

## 2024-09-11 NOTE — Telephone Encounter (Signed)
 Working on forms from [place] but pt no showed with provider yesterday 10/02, unable to complete paperwork if not keeping follow up apts.  Hold until pt calls back.

## 2024-09-15 ENCOUNTER — Telehealth (INDEPENDENT_AMBULATORY_CARE_PROVIDER_SITE_OTHER): Admitting: Adult Health

## 2024-09-15 ENCOUNTER — Encounter: Payer: Self-pay | Admitting: Adult Health

## 2024-09-15 DIAGNOSIS — F909 Attention-deficit hyperactivity disorder, unspecified type: Secondary | ICD-10-CM

## 2024-09-15 DIAGNOSIS — F1994 Other psychoactive substance use, unspecified with psychoactive substance-induced mood disorder: Secondary | ICD-10-CM | POA: Diagnosis not present

## 2024-09-15 DIAGNOSIS — G4700 Insomnia, unspecified: Secondary | ICD-10-CM

## 2024-09-15 DIAGNOSIS — F411 Generalized anxiety disorder: Secondary | ICD-10-CM | POA: Diagnosis not present

## 2024-09-15 DIAGNOSIS — F331 Major depressive disorder, recurrent, moderate: Secondary | ICD-10-CM

## 2024-09-15 DIAGNOSIS — F329 Major depressive disorder, single episode, unspecified: Secondary | ICD-10-CM | POA: Diagnosis not present

## 2024-09-15 DIAGNOSIS — F908 Attention-deficit hyperactivity disorder, other type: Secondary | ICD-10-CM

## 2024-09-15 NOTE — Progress Notes (Signed)
 Brenda Diaz [id number] 23-Dec-1995 28 y.o.  Virtual Visit via Video Note  I connected with pt @ on 09/15/24 at  1:00 PM EDT by a video enabled telemedicine application and verified that I am speaking with the correct person using two identifiers.   I discussed the limitations of evaluation and management by telemedicine and the availability of in person appointments. The patient expressed understanding and agreed to proceed.  I discussed the assessment and treatment plan with the patient. The patient was provided an opportunity to ask questions and all were answered. The patient agreed with the plan and demonstrated an understanding of the instructions.   The patient was advised to call back or seek an in-person evaluation if the symptoms worsen or if the condition fails to improve as anticipated.  I provided 30 minutes of non-face-to-face time during this encounter.  The patient was located at home.  The provider was located at [facility].   Angeline LOISE Sayers, NP   Subjective:   Patient ID:  Brenda Diaz is a 28 y.o. (DOB 1996/03/07) female.  Chief Complaint: No chief complaint on file.   HPI Brenda Diaz presents for follow-up of substance induced mood disorder, MDD, GAD, ADD, and insomnia.  Brenda Diaz has requested FMLA for period 08/24/2024 through 09/18/2024. Brenda Diaz  plans to return to work on 09/21/2024.   Describes mood today as better. Pleasant. Reports Brenda Diaz stopped using substances 2 weeks ago and is feeling better. Reports concerns about returning to work. Mood symptoms - reports some anxiety - much better. Denies depression and irritability. Reports improved interest and motivation. Denies panic attacks over the past week or two. Reports decreased worry, rumination and over thinking - it still exists, but I am able to realize it. Reports some obsessive thoughts related to work and how things will be when Brenda Diaz returns. Reports mood is more stable. Stating I feel  like I'm doing better. Reports the addition of Risperdal  has been helpful for mood stabilization. Energy levels a little bit better. Active, has not been exercising regularly. Reports Brenda Diaz has been enjoying some usual interests and activities. Lives alone - getting out more. No pets. Family lives in [place]. Spending time with friends. Talking with family. Appetite adequate. Reports weight gain - weight loss 30 pounds over the past 6 months - today 135 pounds. Sleeps well most nights. Averages 7 to 8 hours. Reports focus and concentration improved. Completing tasks. Managing aspects of household. Works full-time at [employer], but has been out of work since August 24, 2024. Denies SI or HI.  Denies AH or VH. Denies self harm. Denies THC use since last visit. Working with a Paramedic weekly.  Review of Systems:  Review of Systems  Musculoskeletal:  Negative for gait problem.  Neurological:  Negative for tremors.  Psychiatric/Behavioral:         Please refer to HPI   Medications: I have reviewed the patient's current medications.  Current Outpatient Medications  Medication Sig Dispense Refill   cariprazine  (VRAYLAR ) 1.5 MG capsule Take 1 capsule (1.5 mg total) by mouth daily. 90 capsule 1   risperiDONE  (RISPERDAL ) 1 MG tablet Take 1/2 tablet at bedtime for three nights, then take one tablet at bedtime. 30 tablet 2   traZODone  (DESYREL ) 50 MG tablet Take 1 tablet (50 mg total) by mouth at bedtime. 90 tablet 1   TRINTELLIX  20 MG TABS tablet Take 1 tablet (20 mg total) by mouth daily. 90 tablet 1   No current facility-administered  medications for this visit.    Medication Side Effects: None  Allergies: No Known Allergies  No past medical history on file.  No family history on file.  Social History   Socioeconomic History   Marital status: Single    Spouse name: Not on file   Number of children: Not on file   Years of education: Not on file   Highest education level: Not on  file  Occupational History   Not on file  Tobacco Use   Smoking status: Never   Smokeless tobacco: Never  Substance and Sexual Activity   Alcohol use: Yes    Comment: 2 to 3 drinks on the weekend.    Drug use: Not Currently   Sexual activity: Not on file  Other Topics Concern   Not on file  Social History Narrative   Not on file   Social Drivers of Health   Financial Resource Strain: Low Risk  (01/15/2024)   Received from [facility]   Overall Financial Resource Strain (CARDIA)    Difficulty of Paying Living Expenses: Not hard at all  Food Insecurity: No Food Insecurity (01/15/2024)   Received from [facility]   Hunger Vital Sign    Within the past 12 months, you worried that your food would run out before you got the money to buy more.: Never true    Within the past 12 months, the food you bought just didn't last and you didn't have money to get more.: Never true  Transportation Needs: No Transportation Needs (01/15/2024)   Received from [facility]    Lack of Transportation (Medical): No    Lack of Transportation (Non-Medical): No  Physical Activity: Sufficiently Active (01/14/2023)   Received from [facility]   Exercise Vital Sign    On average, how many days per week do you engage in moderate to strenuous exercise (like a brisk walk)?: 5 days    On average, how many minutes do you engage in exercise at this level?: 50 min  Stress: No Stress Concern Present (01/14/2023)   Received from [facility] - Occupational Stress Questionnaire    Feeling of Stress : Only a little  Social Connections: Moderately Integrated (01/14/2023)   Received from [facility]   Social Network    How would you rate your social network (family, work, friends)?: Adequate participation with social networks  Intimate Partner Violence: Not At Risk (01/14/2023)   Received from [facility]   HITS    Over the last 12 months how  often did your partner physically hurt you?: Never    Over the last 12 months how often did your partner insult you or talk down to you?: Never    Over the last 12 months how often did your partner threaten you with physical harm?: Never    Over the last 12 months how often did your partner scream or curse at you?: Never    Past Medical History, Surgical history, Social history, and Family history were reviewed and updated as appropriate.   Please see review of systems for further details on the patient's review from today.   Objective:   Physical Exam:  There were no vitals taken for this visit.  Physical Exam Constitutional:      General: Brenda Diaz is not in acute distress. Musculoskeletal:        General: No deformity.  Neurological:     Mental Status: Brenda Diaz is  alert and oriented to person, place, and time.     Coordination: Coordination normal.  Psychiatric:        Attention and Perception: Attention and perception normal. Brenda Diaz does not perceive auditory or visual hallucinations.        Mood and Affect: Mood is anxious. Mood is not depressed. Affect is not labile, blunt, angry or inappropriate.        Speech: Speech normal.        Behavior: Behavior normal. Behavior is cooperative.        Thought Content: Thought content normal. Thought content is not paranoid or delusional. Thought content does not include homicidal or suicidal ideation. Thought content does not include homicidal or suicidal plan.        Cognition and Memory: Cognition and memory normal.        Judgment: Judgment normal.     Comments: Insight intact     Lab Review:  No results found for: NA, K, CL, CO2, GLUCOSE, BUN, CREATININE, CALCIUM, PROT, ALBUMIN, AST, ALT, ALKPHOS, BILITOT, GFRNONAA, GFRAA  No results found for: WBC, RBC, HGB, HCT, PLT, MCV, MCH, MCHC, RDW, LYMPHSABS, MONOABS, EOSABS, BASOSABS  No results found for: POCLITH, LITHIUM   No results  found for: PHENYTOIN, PHENOBARB, VALPROATE, CBMZ   .res Assessment: Plan:    Plan:  PDMP reviewed  1. Trintellix  20mg  daily 2. Trazadone 50mg  at bedtime 3. Risperdal  1mg  at bedtime  RTC 4 weeks  30 minutes spent dedicated to the care of this patient on the date of this encounter to include pre-visit review of records, ordering of medication, post visit documentation, and face-to-face time with the patient discussing THC - substance abuse, depression, anxiety, insomnia and obsessional thoughts. Brenda Diaz is on FMLA for period of 08/24/2024 through 09/18/2024. Will plan to return to work on 09/21/2024 without restrictions  Patient advised to contact office with any questions, adverse effects, or acute worsening in signs and symptoms.  Discussed potential metabolic side effects associated with atypical antipsychotics, as well as potential risk for movement side effects. Advised pt to contact office if movement side effects occur.    There are no diagnoses linked to this encounter.   Please see After Visit Summary for patient specific instructions.  Future Appointments  Date Time Provider Department Center  09/15/2024  1:00 PM Avalie Oconnor Nattalie, NP [facility] None  01/27/2025  8:30 AM Tommey Barret Nattalie, NP [facility] None    No orders of the defined types were placed in this encounter.     -------------------------------

## 2024-09-16 NOTE — Telephone Encounter (Signed)
 Pt had apt on 10/07 will complete pw for pt to RTW 09/21/24

## 2024-09-17 NOTE — Telephone Encounter (Signed)
 Paperwork completed and faxed to [facility]. Given to admin staff to send office notes.

## 2024-10-27 ENCOUNTER — Ambulatory Visit (INDEPENDENT_AMBULATORY_CARE_PROVIDER_SITE_OTHER): Admitting: Adult Health

## 2024-10-27 ENCOUNTER — Encounter: Payer: Self-pay | Admitting: Adult Health

## 2024-10-27 DIAGNOSIS — F411 Generalized anxiety disorder: Secondary | ICD-10-CM

## 2024-10-27 DIAGNOSIS — F331 Major depressive disorder, recurrent, moderate: Secondary | ICD-10-CM | POA: Diagnosis not present

## 2024-10-27 DIAGNOSIS — F1994 Other psychoactive substance use, unspecified with psychoactive substance-induced mood disorder: Secondary | ICD-10-CM | POA: Diagnosis not present

## 2024-10-27 DIAGNOSIS — G4700 Insomnia, unspecified: Secondary | ICD-10-CM

## 2024-10-27 MED ORDER — RISPERIDONE 1 MG PO TABS: ORAL_TABLET | ORAL | 2 refills | Status: DC

## 2024-10-27 MED ORDER — RISPERIDONE 1 MG PO TABS: ORAL_TABLET | ORAL | 1 refills | Status: AC

## 2024-10-27 NOTE — Progress Notes (Signed)
 Brenda Diaz [id number] 29-Oct-1996 29 y.o.  Subjective:   Patient ID:  [id number] LATTERELL is a 28 y.o. (DOB 1996-09-05) female.  Chief Complaint: No chief complaint on file.   HPI Tinnie DELENA Lenz presents to the office today for follow-up of substance induced mood disorder, MDD, GAD, ADD, and insomnia.  Describes mood today as ok. Pleasant. Denies tearfulness. Mood symptoms - reports some anxiety - a little bit. Denies depression and irritability. Reports improved interest and motivation. Denies panic attacks. Reports decreased worry, rumination and over thinking - some days - 1 to 2 times a week. Denies obsessive thoughts or acts. Reports mood is more stable. Stating I feel like I'm doing ok - getting better. Reports taking medications as prescribed. Energy levels a little bit better. Active, has not been exercising regularly. Reports she has been enjoying some usual interests and activities - ceramics. Lives alone - getting out some. No pets. Family lives in [place]. Spending time with friends. Talking with family. Appetite adequate. Reports weight gain today 145 pounds. Sleeps well most nights. Averages 7 to 8 hours. Reports focus and concentration improved - better some days than others. Completing tasks. Managing aspects of household. Works full-time at [employer]. Denies SI or HI.  Denies AH or VH. Denies self harm. Denies THC use since last visit. Working with a paramedic weekly.  Review of Systems:  Review of Systems  Musculoskeletal:  Negative for gait problem.  Neurological:  Negative for tremors.  Psychiatric/Behavioral:         Please refer to HPI    Medications: I have reviewed the patient's current medications.  Current Outpatient Medications  Medication Sig Dispense Refill   risperiDONE  (RISPERDAL ) 1 MG tablet Take 1/2 tablet at bedtime for three nights, then take one tablet at bedtime. 30 tablet 2   traZODone  (DESYREL ) 50 MG tablet Take 1 tablet (50 mg  total) by mouth at bedtime. 90 tablet 1   TRINTELLIX  20 MG TABS tablet Take 1 tablet (20 mg total) by mouth daily. 90 tablet 1   No current facility-administered medications for this visit.    Medication Side Effects: None  Allergies: No Known Allergies  No past medical history on file.  Past Medical History, Surgical history, Social history, and Family history were reviewed and updated as appropriate.   Please see review of systems for further details on the patient's review from today.   Objective:   Physical Exam:  There were no vitals taken for this visit.  Physical Exam Constitutional:      General: She is not in acute distress. Musculoskeletal:        General: No deformity.  Neurological:     Mental Status: She is alert and oriented to person, place, and time.     Coordination: Coordination normal.  Psychiatric:        Attention and Perception: Attention and perception normal. She does not perceive auditory or visual hallucinations.        Mood and Affect: Mood normal. Mood is not anxious or depressed. Affect is not labile, blunt, angry or inappropriate.        Speech: Speech normal.        Behavior: Behavior normal.        Thought Content: Thought content normal. Thought content is not paranoid or delusional. Thought content does not include homicidal or suicidal ideation. Thought content does not include homicidal or suicidal plan.        Cognition and Memory: Cognition and memory  normal.        Judgment: Judgment normal.     Comments: Insight intact     Lab Review:  No results found for: NA, K, CL, CO2, GLUCOSE, BUN, CREATININE, CALCIUM, PROT, ALBUMIN, AST, ALT, ALKPHOS, BILITOT, GFRNONAA, GFRAA  No results found for: WBC, RBC, HGB, HCT, PLT, MCV, MCH, MCHC, RDW, LYMPHSABS, MONOABS, EOSABS, BASOSABS  No results found for: POCLITH, LITHIUM   No results found for: PHENYTOIN, PHENOBARB,  VALPROATE, CBMZ   .res Assessment: Plan:    Plan:  PDMP reviewed  1. Trintellix  20mg  daily 2. Trazadone 50mg  at bedtime 3. Risperdal  1mg  at bedtime  RTC 4 weeks  30 minutes spent dedicated to the care of this patient on the date of this encounter to include pre-visit review of records, ordering of medication, post visit documentation, and face-to-face time with the patient discussing depression, anxiety, insomnia and obsessional thoughts. Patient advised to contact office with any questions, adverse effects, or acute worsening in signs and symptoms.  Discussed potential metabolic side effects associated with atypical antipsychotics, as well as potential risk for movement side effects. Advised pt to contact office if movement side effects occur.    Diagnoses and all orders for this visit:  Major depressive disorder, recurrent episode, moderate (HCC)  Substance induced mood disorder (HCC) -     risperiDONE  (RISPERDAL ) 1 MG tablet; Take 1/2 tablet at bedtime for three nights, then take one tablet at bedtime.  Generalized anxiety disorder  Insomnia, unspecified type     Please see After Visit Summary for patient specific instructions.  Future Appointments  Date Time Provider Department Center  01/27/2025  8:30 AM Ayame Rena Nattalie, NP [facility] None    No orders of the defined types were placed in this encounter.   -------------------------------

## 2024-11-26 ENCOUNTER — Encounter: Payer: Self-pay | Admitting: Adult Health

## 2024-11-26 ENCOUNTER — Ambulatory Visit: Admitting: Adult Health

## 2024-11-26 DIAGNOSIS — G4700 Insomnia, unspecified: Secondary | ICD-10-CM

## 2024-11-26 DIAGNOSIS — F1994 Other psychoactive substance use, unspecified with psychoactive substance-induced mood disorder: Secondary | ICD-10-CM

## 2024-11-26 DIAGNOSIS — F411 Generalized anxiety disorder: Secondary | ICD-10-CM

## 2024-11-26 DIAGNOSIS — F331 Major depressive disorder, recurrent, moderate: Secondary | ICD-10-CM

## 2024-11-26 MED ORDER — TRINTELLIX 20 MG PO TABS: 20.0000 mg | ORAL_TABLET | Freq: Every day | ORAL | 1 refills | Status: AC

## 2024-11-26 NOTE — Progress Notes (Signed)
 Brenda Diaz [id number] [birth year] 28 y.o.  Subjective:   Patient ID:  Brenda Diaz is a 28 y.o. (DOB Sep 19, 1996) female.  Chief Complaint: No chief complaint on file.   HPI Brenda Diaz presents to the office today for follow-up of substance induced mood disorder, MDD, GAD, ADD, and insomnia.  Describes mood today as ok. Pleasant. Denies tearfulness. Mood symptoms - denies anxiety, depression and irritability. Reports improved interest and motivation. Denies panic attacks. Denies worry, rumination and over thinking. Reports less obsessive thoughts. Reports mood is more stable. Stating I feel like I'm doing better. Reports taking medications as prescribed. Energy levels improved. Active, has not been exercising regularly. Reports she has been enjoying some usual interests and activities - ceramics. Lives alone. No pets. Family lives in [place]. Spending time with friends. Talking with family. Appetite adequate. Reports weight gain - 147 pounds. Sleeps well most nights. Averages 8 to 9 hours. Reports focus and concentration improved. Completing tasks. Managing aspects of household. Works full-time at [employer]. Denies SI or HI.  Denies AH or VH. Denies self harm. Denies THC use since last visit. Working with a paramedic weekly.   Review of Systems:  Review of Systems  Musculoskeletal:  Negative for gait problem.  Neurological:  Negative for tremors.  Psychiatric/Behavioral:         Please refer to HPI    Medications: I have reviewed the patient's current medications.  Current Outpatient Medications  Medication Sig Dispense Refill   risperiDONE  (RISPERDAL ) 1 MG tablet Take one tablet at bedtime. 90 tablet 1   traZODone  (DESYREL ) 50 MG tablet Take 1 tablet (50 mg total) by mouth at bedtime. 90 tablet 1   TRINTELLIX  20 MG TABS tablet Take 1 tablet (20 mg total) by mouth daily. 90 tablet 1   No current facility-administered medications for this visit.    Medication Side  Effects: None  Allergies: Allergies[1]  No past medical history on file.  Past Medical History, Surgical history, Social history, and Family history were reviewed and updated as appropriate.   Please see review of systems for further details on the patient's review from today.   Objective:   Physical Exam:  There were no vitals taken for this visit.  Physical Exam Constitutional:      General: She is not in acute distress. Musculoskeletal:        General: No deformity.  Neurological:     Mental Status: She is alert and oriented to person, place, and time.     Coordination: Coordination normal.  Psychiatric:        Attention and Perception: Attention and perception normal. She does not perceive auditory or visual hallucinations.        Mood and Affect: Mood normal. Mood is not anxious or depressed. Affect is not labile, blunt, angry or inappropriate.        Speech: Speech normal.        Behavior: Behavior normal.        Thought Content: Thought content normal. Thought content is not paranoid or delusional. Thought content does not include homicidal or suicidal ideation. Thought content does not include homicidal or suicidal plan.        Cognition and Memory: Cognition and memory normal.        Judgment: Judgment normal.     Comments: Insight intact     Lab Review:  No results found for: NA, K, CL, CO2, GLUCOSE, BUN, CREATININE, CALCIUM, PROT, ALBUMIN, AST, ALT, ALKPHOS, BILITOT, GFRNONAA,  GFRAA  No results found for: WBC, RBC, HGB, HCT, PLT, MCV, MCH, MCHC, RDW, LYMPHSABS, MONOABS, EOSABS, BASOSABS  No results found for: POCLITH, LITHIUM   No results found for: PHENYTOIN, PHENOBARB, VALPROATE, CBMZ   .res Assessment: Plan:    Plan:  PDMP reviewed  1. Trintellix  20mg  daily 2. Trazadone 50mg  at bedtime 3. Risperdal  1mg  at bedtime  RTC 2 months  30 minutes spent dedicated to the care of this  patient on the date of this encounter to include pre-visit review of records, ordering of medication, post visit documentation, and face-to-face time with the patient discussing depression, anxiety, insomnia and obsessional thoughts. Patient advised to contact office with any questions, adverse effects, or acute worsening in signs and symptoms.  Discussed potential metabolic side effects associated with atypical antipsychotics, as well as potential risk for movement side effects. Advised pt to contact office if movement side effects occur.    There are no diagnoses linked to this encounter.   Please see After Visit Summary for patient specific instructions.  Future Appointments  Date Time Provider Department Center  11/26/2024  9:00 AM Darlen Gledhill Nattalie, NP [facility] None  01/27/2025  8:30 AM Adelaine Roppolo Nattalie, NP [facility] None    No orders of the defined types were placed in this encounter.   -------------------------------    [1] No Known Allergies

## 2025-01-27 ENCOUNTER — Ambulatory Visit: Admitting: Adult Health
